# Patient Record
Sex: Female | Born: 1995 | Race: White | Hispanic: No | Marital: Married | Smoking: Never smoker
Health system: Southern US, Community
[De-identification: ages and names within clinical notes are randomized; demographics above are authoritative.]

---
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
## 2017-08-21 ENCOUNTER — Emergency Department (HOSPITAL_BASED_OUTPATIENT_CLINIC_OR_DEPARTMENT_OTHER)
Admission: EM | Admit: 2017-08-21 | Discharge: 2017-08-21 | Disposition: A | Discharge status: Home / Self Care | Payer: Managed Care, Other (non HMO) | Attending: Emergency Medicine | Admitting: Emergency Medicine

## 2017-08-21 ENCOUNTER — Encounter (HOSPITAL_BASED_OUTPATIENT_CLINIC_OR_DEPARTMENT_OTHER): Payer: Self-pay | Admitting: Emergency Medicine

## 2017-08-21 ENCOUNTER — Other Ambulatory Visit: Payer: Self-pay

## 2017-08-21 DIAGNOSIS — R070 Pain in throat: Secondary | ICD-10-CM | POA: Insufficient documentation

## 2017-08-21 DIAGNOSIS — J029 Acute pharyngitis, unspecified: Secondary | ICD-10-CM | POA: Diagnosis present

## 2017-08-21 DIAGNOSIS — M542 Cervicalgia: Secondary | ICD-10-CM | POA: Insufficient documentation

## 2017-08-21 MED ORDER — IBUPROFEN 800 MG PO TABS: 800.0000 mg | ORAL_TABLET | Freq: Three times a day (TID) | ORAL | 0 refills | Status: DC

## 2017-08-21 MED ORDER — LIDOCAINE VISCOUS 2 % MT SOLN: 20.0000 mL | OROMUCOSAL | 0 refills | Status: DC | PRN

## 2017-08-21 MED ORDER — HYDROCODONE-ACETAMINOPHEN 5-325 MG PO TABS: 1.0000 | ORAL_TABLET | Freq: Four times a day (QID) | ORAL | 0 refills | Status: DC | PRN

## 2017-08-21 MED ORDER — HYDROCODONE-ACETAMINOPHEN 5-325 MG PO TABS
1.0000 | ORAL_TABLET | Freq: Once | ORAL | Status: AC
  Administered 2017-08-21: 1 via ORAL
  Filled 2017-08-21: qty 1

## 2017-08-21 MED ORDER — LIDOCAINE VISCOUS 2 % MT SOLN
15.0000 mL | Freq: Once | OROMUCOSAL | Status: AC
  Administered 2017-08-21: 15 mL via OROMUCOSAL
  Filled 2017-08-21: qty 15

## 2017-08-21 NOTE — ED Triage Notes (Signed)
Patient states that she has a cyst in her throat and lately it has been getting worse. She states that it is bulging and it hurts to swallow

## 2017-08-23 NOTE — ED Provider Notes (Signed)
[facility] EMERGENCY DEPARTMENT Provider Note   CSN: [id number] Arrival date & time: 08/21/17  1627     History   Chief Complaint Chief Complaint  Patient presents with  . Sore Throat    HPI Linda Bradshaw is a 22 y.o. female.   Sore Throat  This is a recurrent problem. The current episode started more than 2 days ago. The problem occurs constantly. The problem has been gradually worsening. Pertinent negatives include no chest pain, no headaches and no shortness of breath. Nothing aggravates the symptoms. Nothing relieves the symptoms. She has tried nothing for the symptoms.    History reviewed. No pertinent past medical history.  There are no active problems to display for this patient.   History reviewed. No pertinent surgical history.   OB History   None      Home Medications    Prior to Admission medications   Medication Sig Start Date End Date Taking? Authorizing Provider  HYDROcodone-acetaminophen (NORCO/VICODIN) 5-325 MG tablet Take 1 tablet by mouth every 6 (six) hours as needed for severe pain. 08/21/17   Keila Turan, Barbara Cower, MD  ibuprofen (ADVIL,MOTRIN) 800 MG tablet Take 1 tablet (800 mg total) by mouth 3 (three) times daily. 08/21/17   Kailei Cowens, Barbara Cower, MD  lidocaine (XYLOCAINE) 2 % solution Use as directed 20 mLs in the mouth or throat as needed for mouth pain. 08/21/17   Lacole Komorowski, Barbara Cower, MD    Family History History reviewed. No pertinent family history.  Social History Social History   Tobacco Use  . Smoking status: Never Smoker  . Smokeless tobacco: Never Used  Substance Use Topics  . Alcohol use: Never    Frequency: Never  . Drug use: Never     Allergies   Patient has no known allergies.   Review of Systems Review of Systems  Respiratory: Negative for shortness of breath.   Cardiovascular: Negative for chest pain.  Neurological: Negative for headaches.  All other systems reviewed and are negative.    Physical Exam Updated Vital  Signs BP 131/79 (BP Location: Right Arm)   Pulse 82   Temp 98.3 F (36.8 C) (Oral)   Resp 16   Ht  (1.702 m)   Wt (!) 138 kg (304 lb 3.8 oz)   SpO2 100%   BMI 47.65 kg/m   Physical Exam  Constitutional: She is oriented to person, place, and time. She appears well-developed and well-nourished.  HENT:  Head: Normocephalic and atraumatic.  Bilateral tonsillar swelling. L>R, no obvious e/o abscess  Eyes: Pupils are equal, round, and reactive to light. EOM are normal.  Neck: Normal range of motion.  Cardiovascular: Normal rate and regular rhythm.  Pulmonary/Chest: Effort normal and breath sounds normal. No stridor. No respiratory distress.  Abdominal: Soft. Bowel sounds are normal. She exhibits no distension.  Musculoskeletal: Normal range of motion. She exhibits no edema or deformity.  Neurological: She is alert and oriented to person, place, and time. No cranial nerve deficit.  Skin: Skin is warm and dry.  Nursing note and vitals reviewed.    ED Treatments / Results  Labs (all labs ordered are listed, but only abnormal results are displayed) Labs Reviewed - No data to display  EKG None  Radiology No results found.  Procedures Procedures (including critical care time)  Medications Ordered in ED Medications  lidocaine (XYLOCAINE) 2 % viscous mouth solution 15 mL (15 mLs Mouth/Throat Given 08/21/17 1746)  HYDROcodone-acetaminophen (NORCO/VICODIN) 5-325 MG per tablet 1 tablet (1 tablet  Oral Given 08/21/17 1745)     Initial Impression / Assessment and Plan / ED Course  I have reviewed the triage vital signs and the nursing notes.  Pertinent labs & imaging results that were available during my care of the patient were reviewed by me and considered in my medical decision making (see chart for details).    Recurrent cyst. Needs to see ENT. Doubt abscess as it doesn't hurt and no fever, erythema, exudates to suggest same. No airway involvement. No difficulty swallowign.     Final Clinical Impressions(s) / ED Diagnoses   Final diagnoses:  Neck pain  Throat pain    ED Discharge Orders        Ordered    lidocaine (XYLOCAINE) 2 % solution  As needed     08/21/17 1739    HYDROcodone-acetaminophen (NORCO/VICODIN) 5-325 MG tablet  Every 6 hours PRN     08/21/17 1739    ibuprofen (ADVIL,MOTRIN) 800 MG tablet  3 times daily     08/21/17 1739       Shaeleigh Graw, Barbara Cower, MD 08/23/17 1526

## 2017-10-08 ENCOUNTER — Encounter (HOSPITAL_BASED_OUTPATIENT_CLINIC_OR_DEPARTMENT_OTHER): Payer: Self-pay | Admitting: Emergency Medicine

## 2017-10-08 ENCOUNTER — Emergency Department (HOSPITAL_BASED_OUTPATIENT_CLINIC_OR_DEPARTMENT_OTHER)
Admission: EM | Admit: 2017-10-08 | Discharge: 2017-10-09 | Disposition: A | Discharge status: Home / Self Care | Payer: Managed Care, Other (non HMO) | Attending: Emergency Medicine | Admitting: Emergency Medicine

## 2017-10-08 ENCOUNTER — Other Ambulatory Visit: Payer: Self-pay

## 2017-10-08 DIAGNOSIS — J029 Acute pharyngitis, unspecified: Secondary | ICD-10-CM | POA: Insufficient documentation

## 2017-10-08 DIAGNOSIS — Z79899 Other long term (current) drug therapy: Secondary | ICD-10-CM | POA: Insufficient documentation

## 2017-10-08 DIAGNOSIS — R51 Headache: Secondary | ICD-10-CM | POA: Diagnosis present

## 2017-10-08 LAB — RAPID STREP SCREEN (MED CTR MEBANE ONLY): Streptococcus, Group A Screen (Direct): NEGATIVE

## 2017-10-08 MED ORDER — ACETAMINOPHEN 325 MG PO TABS
650.0000 mg | ORAL_TABLET | Freq: Once | ORAL | Status: AC | PRN
  Administered 2017-10-08: 650 mg via ORAL
  Filled 2017-10-08: qty 2

## 2017-10-08 MED ORDER — METOCLOPRAMIDE HCL 5 MG/ML IJ SOLN
10.0000 mg | Freq: Once | INTRAMUSCULAR | Status: AC
  Administered 2017-10-08: 10 mg via INTRAVENOUS
  Filled 2017-10-08: qty 2

## 2017-10-08 MED ORDER — KETOROLAC TROMETHAMINE 15 MG/ML IJ SOLN
15.0000 mg | Freq: Once | INTRAMUSCULAR | Status: AC
  Administered 2017-10-08: 15 mg via INTRAVENOUS
  Filled 2017-10-08: qty 1

## 2017-10-08 MED ORDER — SODIUM CHLORIDE 0.9 % IV BOLUS
1000.0000 mL | Freq: Once | INTRAVENOUS | Status: AC
  Administered 2017-10-08: 1000 mL via INTRAVENOUS

## 2017-10-08 MED ORDER — DIPHENHYDRAMINE HCL 50 MG/ML IJ SOLN
25.0000 mg | Freq: Once | INTRAMUSCULAR | Status: AC
  Administered 2017-10-08: 25 mg via INTRAVENOUS
  Filled 2017-10-08: qty 1

## 2017-10-08 NOTE — ED Triage Notes (Signed)
Patient states that woke up with a sore throat and headache this am  - the patient also states that she is dizzy - the patient states that she has taken some OTC medications and that has not helped

## 2017-10-08 NOTE — ED Provider Notes (Signed)
[facility] Provider Note: Lowella DellJ. Lane Eknoor Novack, MD, FACEP  CSN: [id number] MRN: [id number] ARRIVAL: 10/08/17 at 1843 ROOM: [facility]03/[facility]03   CHIEF COMPLAINT  Headache and Sore Throat   HISTORY OF PRESENT ILLNESS  10/08/17 11:14 PM Linda Bradshaw is a 22 y.o. female who complains of a "really bad" headache and sore throat since this morning.  She states the pain is so severe it is making her lightheaded.  She has been nauseated today and vomited at work earlier.  She denies nausea at the present time.  She was noted to have a low-grade fever of 99.7 on arrival.  She has taken over-the-counter analgesics without relief.  Throat pain is worse with swallowing.   History reviewed. No pertinent past medical history.  History reviewed. No pertinent surgical history.  History reviewed. No pertinent family history.  Social History   Tobacco Use  . Smoking status: Never Smoker  . Smokeless tobacco: Never Used  Substance Use Topics  . Alcohol use: Never    Frequency: Never  . Drug use: Never    Prior to Admission medications   Medication Sig Start Date End Date Taking? Authorizing Provider  HYDROcodone-acetaminophen (NORCO/VICODIN) 5-325 MG tablet Take 1 tablet by mouth every 6 (six) hours as needed for severe pain. 08/21/17   Mesner, Barbara CowerJason, MD  ibuprofen (ADVIL,MOTRIN) 800 MG tablet Take 1 tablet (800 mg total) by mouth 3 (three) times daily. 08/21/17   Mesner, Barbara CowerJason, MD  lidocaine (XYLOCAINE) 2 % solution Use as directed 20 mLs in the mouth or throat as needed for mouth pain. 08/21/17   Mesner, Barbara CowerJason, MD    Allergies Patient has no known allergies.   REVIEW OF SYSTEMS  Negative except as noted here or in the History of Present Illness.   PHYSICAL EXAMINATION  Initial Vital Signs Blood pressure 94/72, pulse 94, temperature 99.7 F (37.6 C), temperature source Oral, resp. rate 18, height 5\' 6"  (1.676 m), weight 136.1 kg (300 lb), SpO2 100 %.  Examination General:  Well-developed, well-nourished female in no acute distress; appearance consistent with age of record HENT: normocephalic; atraumatic; no pharyngeal erythema or exudate Eyes: pupils equal, round and reactive to light; extraocular muscles intact Neck: supple; anterior cervical lymphadenopathy Heart: regular rate and rhythm Lungs: clear to auscultation bilaterally Abdomen: soft; nondistended; nontender; no masses or hepatosplenomegaly; bowel sounds present Extremities: No deformity; full range of motion; pulses normal Neurologic: Awake, alert and oriented; motor function intact in all extremities and symmetric; no facial droop Skin: Warm and dry Psychiatric: Normal mood and affect   RESULTS  Summary of this visit's results, reviewed by myself:   EKG Interpretation  Date/Time:    Ventricular Rate:    PR Interval:    QRS Duration:   QT Interval:    QTC Calculation:   R Axis:     Text Interpretation:        Laboratory Studies: Results for orders placed or performed during the hospital encounter of 10/08/17 (from the past 24 hour(s))  Rapid Strep Screen ([facility])     Status: None   Collection Time: 10/08/17  7:15 PM  Result Value Ref Range   Streptococcus, Group A Screen (Direct) NEGATIVE NEGATIVE  CBC with Differential/Platelet     Status: Abnormal   Collection Time: 10/08/17 11:29 PM  Result Value Ref Range   WBC 11.7 (H) 4.0 - 10.5 K/uL   RBC 4.33 3.87 - 5.11 MIL/uL   Hemoglobin 12.0 12.0 - 15.0 g/dL  HCT 35.8 (L) 36.0 - 46.0 %   MCV 82.7 78.0 - 100.0 fL   MCH 27.7 26.0 - 34.0 pg   MCHC 33.5 30.0 - 36.0 g/dL   RDW 16.1 09.6 - 04.5 %   Platelets 218 150 - 400 K/uL   Neutrophils Relative % 77 %   Neutro Abs 8.9 (H) 1.7 - 7.7 K/uL   Lymphocytes Relative 16 %   Lymphs Abs 1.9 0.7 - 4.0 K/uL   Monocytes Relative 7 %   Monocytes Absolute 0.8 0.1 - 1.0 K/uL   Eosinophils Relative 0 %   Eosinophils Absolute 0.0 0.0 - 0.7 K/uL   Basophils Relative 0 %   Basophils  Absolute 0.0 0.0 - 0.1 K/uL  Mononucleosis screen     Status: None   Collection Time: 10/08/17 11:29 PM  Result Value Ref Range   Mono Screen NEGATIVE NEGATIVE   Imaging Studies: No results found.  ED COURSE and MDM  Nursing notes and initial vitals signs, including pulse oximetry, reviewed.  Vitals:   10/08/17 1845 10/08/17 2015 10/08/17 2231  BP: 118/72 121/62 94/72  Pulse: (!) 102  94  Resp: 20 18 18   Temp: 99.7 F (37.6 C)    TempSrc: Oral    SpO2: 99% 100% 100%  Weight: 136.1 kg (300 lb)    Height: 5\' 6"  (1.676 m)     12:38 AM Patient feeling better after IV fluids and medications.  No evidence of strep or mononucleosis on lab work although she was advised that mononucleosis may not show at this early stage.  PROCEDURES    ED DIAGNOSES     ICD-10-CM   1. Viral pharyngitis J02.9        Najee Cowens, MD 10/09/17 [id number]

## 2017-10-09 LAB — MONONUCLEOSIS SCREEN: Mono Screen: NEGATIVE

## 2017-10-09 LAB — CBC WITH DIFFERENTIAL/PLATELET
Basophils Absolute: 0 10*3/uL (ref 0.0–0.1)
Basophils Relative: 0 %
Eosinophils Absolute: 0 10*3/uL (ref 0.0–0.7)
Eosinophils Relative: 0 %
HCT: 35.8 % — ABNORMAL LOW (ref 36.0–46.0)
HEMOGLOBIN: 12 g/dL (ref 12.0–15.0)
LYMPHS ABS: 1.9 10*3/uL (ref 0.7–4.0)
LYMPHS PCT: 16 %
MCH: 27.7 pg (ref 26.0–34.0)
MCHC: 33.5 g/dL (ref 30.0–36.0)
MCV: 82.7 fL (ref 78.0–100.0)
Monocytes Absolute: 0.8 10*3/uL (ref 0.1–1.0)
Monocytes Relative: 7 %
NEUTROS ABS: 8.9 10*3/uL — AB (ref 1.7–7.7)
NEUTROS PCT: 77 %
Platelets: 218 10*3/uL (ref 150–400)
RBC: 4.33 MIL/uL (ref 3.87–5.11)
RDW: 12.8 % (ref 11.5–15.5)
WBC: 11.7 10*3/uL — ABNORMAL HIGH (ref 4.0–10.5)

## 2017-10-11 LAB — CULTURE, GROUP A STREP (THRC)

## 2018-11-24 ENCOUNTER — Emergency Department (HOSPITAL_COMMUNITY): Payer: Managed Care, Other (non HMO)

## 2018-11-24 ENCOUNTER — Other Ambulatory Visit: Payer: Self-pay

## 2018-11-24 ENCOUNTER — Observation Stay (HOSPITAL_COMMUNITY)
Admission: EM | Admit: 2018-11-24 | Discharge: 2018-11-25 | Disposition: A | Discharge status: Home / Self Care | Payer: Managed Care, Other (non HMO) | Attending: Orthopedic Surgery | Admitting: Orthopedic Surgery

## 2018-11-24 ENCOUNTER — Encounter (HOSPITAL_COMMUNITY): Payer: Self-pay

## 2018-11-24 DIAGNOSIS — S82422A Displaced transverse fracture of shaft of left fibula, initial encounter for closed fracture: Secondary | ICD-10-CM

## 2018-11-24 DIAGNOSIS — S82402B Unspecified fracture of shaft of left fibula, initial encounter for open fracture type I or II: Secondary | ICD-10-CM | POA: Diagnosis present

## 2018-11-24 DIAGNOSIS — Z20828 Contact with and (suspected) exposure to other viral communicable diseases: Secondary | ICD-10-CM | POA: Insufficient documentation

## 2018-11-24 DIAGNOSIS — Z23 Encounter for immunization: Secondary | ICD-10-CM | POA: Diagnosis not present

## 2018-11-24 DIAGNOSIS — Z6841 Body Mass Index (BMI) 40.0 and over, adult: Secondary | ICD-10-CM | POA: Diagnosis not present

## 2018-11-24 DIAGNOSIS — W109XXA Fall (on) (from) unspecified stairs and steps, initial encounter: Secondary | ICD-10-CM | POA: Insufficient documentation

## 2018-11-24 DIAGNOSIS — S82392B Other fracture of lower end of left tibia, initial encounter for open fracture type I or II: Secondary | ICD-10-CM

## 2018-11-24 DIAGNOSIS — S8262XB Displaced fracture of lateral malleolus of left fibula, initial encounter for open fracture type I or II: Secondary | ICD-10-CM | POA: Diagnosis not present

## 2018-11-24 DIAGNOSIS — E669 Obesity, unspecified: Secondary | ICD-10-CM | POA: Insufficient documentation

## 2018-11-24 DIAGNOSIS — S82432B Displaced oblique fracture of shaft of left fibula, initial encounter for open fracture type I or II: Secondary | ICD-10-CM

## 2018-11-24 MED ORDER — CEFAZOLIN SODIUM-DEXTROSE 2-4 GM/100ML-% IV SOLN
2.0000 g | Freq: Once | INTRAVENOUS | Status: AC
  Administered 2018-11-24: 2 g via INTRAVENOUS
  Filled 2018-11-24: qty 100

## 2018-11-24 MED ORDER — FENTANYL CITRATE (PF) 100 MCG/2ML IJ SOLN: 100.0000 ug | Freq: Once | INTRAMUSCULAR | Status: DC

## 2018-11-24 MED ORDER — HYDROMORPHONE HCL 1 MG/ML IJ SOLN
1.0000 mg | Freq: Once | INTRAMUSCULAR | Status: AC
  Administered 2018-11-24: 1 mg via INTRAVENOUS
  Filled 2018-11-24: qty 1

## 2018-11-24 MED ORDER — TETANUS-DIPHTH-ACELL PERTUSSIS 5-2.5-18.5 LF-MCG/0.5 IM SUSP
0.5000 mL | Freq: Once | INTRAMUSCULAR | Status: AC
  Administered 2018-11-24: 0.5 mL via INTRAMUSCULAR
  Filled 2018-11-24: qty 0.5

## 2018-11-24 MED ORDER — OXYCODONE-ACETAMINOPHEN 5-325 MG PO TABS
1.0000 | ORAL_TABLET | Freq: Once | ORAL | Status: AC
  Administered 2018-11-24: 1 via ORAL
  Filled 2018-11-24: qty 1

## 2018-11-24 NOTE — H&P (Signed)
Linda Bradshaw is an 23 y.o. female.  HPI: Patient is an obese otherwise healthy 23 year old female who fell earlier today.  She suffered a fracture of her distal fibula with medial clear space widening.  She had a medial wound which continue to bleed which is concerning for open fracture.  She obviously need surgical intervention and because of the risk of contamination we brought her to the hospital for IV antibiotics overnight and will fix her when operative time available tomorrow.  History reviewed. No pertinent past medical history.  History reviewed. No pertinent surgical history.  History reviewed. No pertinent family history.  Social History:  reports that she has never smoked. She has never used smokeless tobacco. She reports that she does not drink alcohol or use drugs.  Allergies: No Known Allergies  Medications: I have reviewed the patient's current medications.  No results found for this or any previous visit (from the past 48 hour(s)).  Dg Tibia/fibula Left  Result Date: 11/24/2018 CLINICAL DATA:  Injury EXAM: LEFT TIBIA AND FIBULA - 2 VIEW COMPARISON:  None. FINDINGS: Acute mildly displaced distal tibial fibular diaphyseal fracture. Mid and proximal tibia and fibula appear intact. IMPRESSION: Proximal and mid tibia and fibula appear intact. See separately dictated ankle report Electronically Signed   By: Donavan Foil M.D.   On: 11/24/2018 20:25   Dg Ankle Complete Left  Result Date: 11/24/2018 CLINICAL DATA:  Deformity, fall EXAM: LEFT ANKLE COMPLETE - 3+ VIEW COMPARISON:  None. FINDINGS: Acute fracture involving the distal shaft of the fibula at the junction of the middle and distal thirds. Less than 1/4 bone with lateral displacement of distal fracture fragment. Multiple osseous fragment posterior to the ankle, possibly posterior malleolar fracture fragments. Disruption of the ankle mortise which is widened medially. There is mild widening of the lateral clear space.  IMPRESSION: 1. Acute mildly displaced fracture involving the distal shaft of the fibula at the junction of the middle and distal thirds. 2. Multiple fracture fragments posterior to the ankle on lateral view, possibly from the posterior malleolus. Disruption of the ankle mortise with widening of the medial and lateral clear space. Electronically Signed   By: Donavan Foil M.D.   On: 11/24/2018 20:24    ROS  ROS: I have reviewed the patient's review of systems thoroughly and there are no positive responses as relates to the HPI. Blood pressure 129/66, pulse 83, temperature 98.4 F (36.9 C), temperature source Oral, resp. rate 12, height 5\' 7"  (1.702 m), SpO2 100 %. Physical Exam Well-developed well-nourished patient in no acute distress. Alert and oriented x3 HEENT:within normal limits Cardiac: Regular rate and rhythm Pulmonary: Lungs clear to auscultation Abdomen: Soft and nontender.  Normal active bowel sounds  Musculoskeletal: Left leg: Small punctate wound over the medial aspect with continuous bleeding.  Pain with all range of motion.  Neurovascular intact distally.  Assessment/Plan: Patient has a Weber C ankle fracture with mortise widening that is likely open based on a small punctate wound which will not stop bleeding.//Clear the patient needs surgery and I think because of the risk that this could be an open injury I think she probably needs to be admitted to the hospital overnight for IV antibiotics and taken the operating room when operative time available.  The patient is well aware of the risks of bleeding, infection, need for further surgery, and failure of the surgery to help.  She is well aware that there is a small risk of death and around the  time of surgery.  Understanding these risks he does wish to proceed.  Harvie JuniorJohn L Comfort Iversen 11/24/2018, 11:19 PM

## 2018-11-24 NOTE — ED Triage Notes (Signed)
Per EMS, Pt was walking down a set of steps and landed wrong. Pt has a obvious deformity, and instability of left ankle. Small laceration on left lateral side of ankle. Pt states she saw bone in wound, EMS did not observe any bone. Good pulses and cap refill, some numbness on lateral side of left leg. 150 mcg of fentanyl given in route to ED.

## 2018-11-24 NOTE — ED Provider Notes (Addendum)
[facility]   CSN: [id number] Arrival date & time: 11/24/18  [id number]    History   Chief Complaint Chief Complaint  Patient presents with   Ankle Pain    HPI Linda Bradshaw is a 23 y.o. female presenting to the emergency department with sudden onset of left ankle pain after trip and fall down the steps.  She states she scraped to the medial aspect of her ankle on the concrete.  She not hit her head or pass out.  She has an abrasion to the right ankle as well, however is having no trouble ambulating or pain associated.  She was given fentanyl in route which provided temporary relief of symptoms, however her pain returns on evaluation.  Denies numbness or tingling.  Last tetanus vaccine is unknown.     The history is provided by the patient.    History reviewed. No pertinent past medical history.  There are no active problems to display for this patient.   History reviewed. No pertinent surgical history.   OB History   No obstetric history on file.      Home Medications    Prior to Admission medications   Medication Sig Start Date End Date Taking? Authorizing Provider  ibuprofen (ADVIL) 200 MG tablet Take 400 mg by mouth daily as needed for headache.   Yes [provider]  levonorgestrel (MIRENA) 20 MCG/24HR IUD 1 Intra Uterine Device by Intrauterine route once.   Yes [provider]  ibuprofen (ADVIL,MOTRIN) 800 MG tablet Take 1 tablet (800 mg total) by mouth 3 (three) times daily. Patient not taking: Reported on 11/24/2018 08/21/17   Mesner, Barbara CowerJason, MD  lidocaine (XYLOCAINE) 2 % solution Use as directed 20 mLs in the mouth or throat as needed for mouth pain. Patient not taking: Reported on 11/24/2018 08/21/17   Mesner, Barbara CowerJason, MD    Family History History reviewed. No pertinent family history.  Social History Social History   Tobacco Use   Smoking status: Never Smoker   Smokeless tobacco: Never Used    Substance Use Topics   Alcohol use: Never    Frequency: Never   Drug use: Never     Allergies   Patient has no known allergies.   Review of Systems Review of Systems  Musculoskeletal: Positive for arthralgias and joint swelling.  Skin: Positive for wound.  Neurological: Negative for numbness.  All other systems reviewed and are negative.    Physical Exam Updated Vital Signs BP 129/66 (BP Location: Left Arm)    Pulse 83    Temp 98.4 F (36.9 C) (Oral)    Resp 12    Ht 5\' 7"  (1.702 m)    SpO2 100%    BMI 46.99 kg/m   Physical Exam Vitals signs and nursing note reviewed.  Constitutional:      Appearance: She is well-developed.  HENT:     Head: Normocephalic and atraumatic.  Eyes:     Conjunctiva/sclera: Conjunctivae normal.  Cardiovascular:     Rate and Rhythm: Normal rate.  Pulmonary:     Effort: Pulmonary effort is normal.  Abdominal:     Palpations: Abdomen is soft.  Musculoskeletal:     Comments: Left ankle with swelling and slight deformity.  There is generalized tenderness to the ankle joint.  There is an abrasion/puncture wound to the medial ankle proximal to the medial malleolus.  Intact dorsalis pedis pulse.  Normal distal sensation.  Calf is soft.  Right lateral ankle  with superficial abrasion, no tenderness.  Full active range of motion without pain.  Skin:    General: Skin is warm.  Neurological:     Mental Status: She is alert.  Psychiatric:        Behavior: Behavior normal.      ED Treatments / Results  Labs (all labs ordered are listed, but only abnormal results are displayed) Labs Reviewed  SARS CORONAVIRUS 2 (HOSPITAL ORDER, PERFORMED IN [facility])  BASIC METABOLIC PANEL  CBC WITH DIFFERENTIAL/PLATELET    EKG None  Radiology Dg Tibia/fibula Left  Result Date: 11/24/2018 CLINICAL DATA:  Injury EXAM: LEFT TIBIA AND FIBULA - 2 VIEW COMPARISON:  None. FINDINGS: Acute mildly displaced distal tibial fibular diaphyseal  fracture. Mid and proximal tibia and fibula appear intact. IMPRESSION: Proximal and mid tibia and fibula appear intact. See separately dictated ankle report Electronically Signed   By: Jasmine PangKim  Fujinaga M.D.   On: 11/24/2018 20:25   Dg Ankle Complete Left  Result Date: 11/24/2018 CLINICAL DATA:  Deformity, fall EXAM: LEFT ANKLE COMPLETE - 3+ VIEW COMPARISON:  None. FINDINGS: Acute fracture involving the distal shaft of the fibula at the junction of the middle and distal thirds. Less than 1/4 bone with lateral displacement of distal fracture fragment. Multiple osseous fragment posterior to the ankle, possibly posterior malleolar fracture fragments. Disruption of the ankle mortise which is widened medially. There is mild widening of the lateral clear space. IMPRESSION: 1. Acute mildly displaced fracture involving the distal shaft of the fibula at the junction of the middle and distal thirds. 2. Multiple fracture fragments posterior to the ankle on lateral view, possibly from the posterior malleolus. Disruption of the ankle mortise with widening of the medial and lateral clear space. Electronically Signed   By: Jasmine PangKim  Fujinaga M.D.   On: 11/24/2018 20:24    Procedures Procedures (including critical care time)  Medications Ordered in ED Medications  ceFAZolin (ANCEF) IVPB 2g/100 mL premix (has no administration in time range)  HYDROmorphone (DILAUDID) injection 1 mg (has no administration in time range)  Tdap (BOOSTRIX) injection 0.5 mL (0.5 mLs Intramuscular Given 11/24/18 2103)  oxyCODONE-acetaminophen (PERCOCET/ROXICET) 5-325 MG per tablet 1 tablet (1 tablet Oral Given 11/24/18 2101)     Initial Impression / Assessment and Plan / ED Course  I have reviewed the triage vital signs and the nursing notes.  Pertinent labs & imaging results that were available during my care of the patient were reviewed by me and considered in my medical decision making (see chart for details).  Clinical Course as of Nov 23 2308  Caleen EssexFri Nov 24, 2018  2052 Consulted orthopedics, Dr. Luiz BlareGraves.  Recommends Xeroform over wound, bulky compressive dressing with splint, compression to help with swelling. See outpt Monday morning.    [JR]    Clinical Course User Index [JR] Carey Lafon, [place] N, PA-C      Patient with left ankle fracture after a fall down the steps.  There is a displaced fracture of the distal fibular shaft and fracture of the suspected posterior malleolus of the left ankle with mortise widening.  Neurovascularly intact.  Compartments are soft. Tetanus updated.  There is a wound over the medial aspect of the ankle, a few inches proximal to the medial malleolus. Initially not a suspicious for an open fracture given the location of the wound and the location of the fractures, however wound continued to ooze despite greater than 10 minutes of direct pressure.  Reconsulted Dr. Luiz BlareGraves, who  is already in the ED for another patient.  He evaluated patient at bedside and will go ahead and admit patient tonight for operative procedure tomorrow morning.  Concern for possible communication of wound with fracture given persistence of bleeding.  Ordered Ancef per Dr. Berenice Primas.  Appreciate consult.   The patient appears reasonably stabilized for admission considering the current resources, flow, and capabilities available in the ED at this time, and I doubt any other [facility] requiring further screening and/or treatment in the ED prior to admission. Final Clinical Impressions(s) / ED Diagnoses   Final diagnoses:  Other type I or II open fracture of distal end of left tibia, initial encounter  Closed displaced transverse fracture of shaft of left fibula, initial encounter    ED Discharge Orders    None       Milliana Reddoch, [place] N, PA-C 11/24/18 2310    Zethan Alfieri, [place] N, PA-C 11/25/18 6754    Milton Ferguson, MD 11/28/18 [phone]

## 2018-11-25 ENCOUNTER — Encounter (HOSPITAL_COMMUNITY): Payer: Self-pay | Admitting: *Deleted

## 2018-11-25 ENCOUNTER — Observation Stay (HOSPITAL_COMMUNITY): Payer: Managed Care, Other (non HMO) | Admitting: Certified Registered Nurse Anesthetist

## 2018-11-25 ENCOUNTER — Other Ambulatory Visit: Payer: Self-pay

## 2018-11-25 ENCOUNTER — Inpatient Hospital Stay: Admit: 2018-11-25 | Payer: Managed Care, Other (non HMO) | Admitting: Orthopedic Surgery

## 2018-11-25 ENCOUNTER — Encounter (HOSPITAL_COMMUNITY)
Admission: EM | Disposition: A | Discharge status: Home / Self Care | Payer: Self-pay | Source: Home / Self Care | Attending: Emergency Medicine

## 2018-11-25 DIAGNOSIS — S82402B Unspecified fracture of shaft of left fibula, initial encounter for open fracture type I or II: Secondary | ICD-10-CM | POA: Diagnosis present

## 2018-11-25 HISTORY — PX: ORIF ANKLE FRACTURE: SHX5408

## 2018-11-25 LAB — CBC WITH DIFFERENTIAL/PLATELET
Abs Immature Granulocytes: 0.04 10*3/uL (ref 0.00–0.07)
Basophils Absolute: 0 10*3/uL (ref 0.0–0.1)
Basophils Relative: 0 %
Eosinophils Absolute: 0 10*3/uL (ref 0.0–0.5)
Eosinophils Relative: 0 %
HCT: 33.9 % — ABNORMAL LOW (ref 36.0–46.0)
Hemoglobin: 10.9 g/dL — ABNORMAL LOW (ref 12.0–15.0)
Immature Granulocytes: 0 %
Lymphocytes Relative: 19 %
Lymphs Abs: 2.6 10*3/uL (ref 0.7–4.0)
MCH: 27.3 pg (ref 26.0–34.0)
MCHC: 32.2 g/dL (ref 30.0–36.0)
MCV: 84.8 fL (ref 80.0–100.0)
Monocytes Absolute: 0.7 10*3/uL (ref 0.1–1.0)
Monocytes Relative: 5 %
Neutro Abs: 10.1 10*3/uL — ABNORMAL HIGH (ref 1.7–7.7)
Neutrophils Relative %: 76 %
Platelets: 289 10*3/uL (ref 150–400)
RBC: 4 MIL/uL (ref 3.87–5.11)
RDW: 12.5 % (ref 11.5–15.5)
WBC: 13.5 10*3/uL — ABNORMAL HIGH (ref 4.0–10.5)
nRBC: 0 % (ref 0.0–0.2)

## 2018-11-25 LAB — BASIC METABOLIC PANEL
Anion gap: 9 (ref 5–15)
BUN: 16 mg/dL (ref 6–20)
CO2: 21 mmol/L — ABNORMAL LOW (ref 22–32)
Calcium: 8.5 mg/dL — ABNORMAL LOW (ref 8.9–10.3)
Chloride: 108 mmol/L (ref 98–111)
Creatinine, Ser: 0.69 mg/dL (ref 0.44–1.00)
GFR calc Af Amer: >60 mL/min (ref >60)
GFR calc non Af Amer: >60 mL/min (ref >60)
Glucose, Bld: 110 mg/dL — ABNORMAL HIGH (ref 70–99)
Potassium: 3.4 mmol/L — ABNORMAL LOW (ref 3.5–5.1)
Sodium: 138 mmol/L (ref 135–145)

## 2018-11-25 LAB — SARS CORONAVIRUS 2 BY RT PCR (HOSPITAL ORDER, PERFORMED IN ~~LOC~~ HOSPITAL LAB): SARS Coronavirus 2: NEGATIVE

## 2018-11-25 LAB — SURGICAL PCR SCREEN
MRSA, PCR: NEGATIVE
Staphylococcus aureus: NEGATIVE

## 2018-11-25 SURGERY — OPEN REDUCTION INTERNAL FIXATION (ORIF) ANKLE FRACTURE
Anesthesia: General | Site: Ankle | Laterality: Left

## 2018-11-25 MED ORDER — BUPIVACAINE HCL (PF) 0.5 % IJ SOLN
INTRAMUSCULAR | Status: DC | PRN
  Administered 2018-11-25: 20 mL

## 2018-11-25 MED ORDER — MIDAZOLAM HCL 5 MG/5ML IJ SOLN
INTRAMUSCULAR | Status: DC | PRN
  Administered 2018-11-25: 2 mg via INTRAVENOUS

## 2018-11-25 MED ORDER — OXYCODONE-ACETAMINOPHEN 5-325 MG PO TABS: 1.0000 | ORAL_TABLET | Freq: Four times a day (QID) | ORAL | 0 refills | Status: DC | PRN

## 2018-11-25 MED ORDER — SODIUM CHLORIDE 0.9 % IV SOLN
INTRAVENOUS | Status: AC
  Filled 2018-11-25: qty 500000

## 2018-11-25 MED ORDER — DEXTROSE 5 % IV SOLN
INTRAVENOUS | Status: DC | PRN
  Administered 2018-11-25: 14:00:00 3 g via INTRAVENOUS

## 2018-11-25 MED ORDER — BUPIVACAINE HCL (PF) 0.5 % IJ SOLN
INTRAMUSCULAR | Status: AC
  Filled 2018-11-25: qty 30

## 2018-11-25 MED ORDER — HYDROMORPHONE HCL 1 MG/ML IJ SOLN: 0.2500 mg | INTRAMUSCULAR | Status: DC | PRN

## 2018-11-25 MED ORDER — OXYCODONE HCL 5 MG PO TABS
5.0000 mg | ORAL_TABLET | ORAL | Status: DC | PRN
  Administered 2018-11-25: 10 mg via ORAL
  Filled 2018-11-25: qty 2

## 2018-11-25 MED ORDER — CEFAZOLIN SODIUM-DEXTROSE 2-4 GM/100ML-% IV SOLN
INTRAVENOUS | Status: AC
  Filled 2018-11-25: qty 200

## 2018-11-25 MED ORDER — FENTANYL CITRATE (PF) 250 MCG/5ML IJ SOLN
INTRAMUSCULAR | Status: AC
  Filled 2018-11-25: qty 5

## 2018-11-25 MED ORDER — ONDANSETRON HCL 4 MG/2ML IJ SOLN
INTRAMUSCULAR | Status: DC | PRN
  Administered 2018-11-25: 4 mg via INTRAVENOUS

## 2018-11-25 MED ORDER — LIDOCAINE 2% (20 MG/ML) 5 ML SYRINGE
INTRAMUSCULAR | Status: DC | PRN
  Administered 2018-11-25: 100 mg via INTRAVENOUS

## 2018-11-25 MED ORDER — HYDROMORPHONE HCL 1 MG/ML IJ SOLN
0.5000 mg | INTRAMUSCULAR | Status: DC | PRN
  Filled 2018-11-25: qty 1

## 2018-11-25 MED ORDER — SUCCINYLCHOLINE CHLORIDE 200 MG/10ML IV SOSY
PREFILLED_SYRINGE | INTRAVENOUS | Status: DC | PRN
  Administered 2018-11-25: 160 mg via INTRAVENOUS

## 2018-11-25 MED ORDER — PROPOFOL 10 MG/ML IV BOLUS
INTRAVENOUS | Status: AC
  Filled 2018-11-25: qty 20

## 2018-11-25 MED ORDER — 0.9 % SODIUM CHLORIDE (POUR BTL) OPTIME
TOPICAL | Status: DC | PRN
  Administered 2018-11-25: 1000 mL

## 2018-11-25 MED ORDER — METHOCARBAMOL 500 MG PO TABS: 500.0000 mg | ORAL_TABLET | Freq: Four times a day (QID) | ORAL | Status: DC | PRN

## 2018-11-25 MED ORDER — ONDANSETRON HCL 4 MG/2ML IJ SOLN
INTRAMUSCULAR | Status: AC
  Filled 2018-11-25: qty 2

## 2018-11-25 MED ORDER — METOCLOPRAMIDE HCL 5 MG/ML IJ SOLN
10.0000 mg | Freq: Four times a day (QID) | INTRAMUSCULAR | Status: DC | PRN
  Administered 2018-11-25: 10 mg via INTRAVENOUS
  Filled 2018-11-25: qty 2

## 2018-11-25 MED ORDER — FENTANYL CITRATE (PF) 250 MCG/5ML IJ SOLN
INTRAMUSCULAR | Status: DC | PRN
  Administered 2018-11-25: 50 ug via INTRAVENOUS
  Administered 2018-11-25 (×2): 100 ug via INTRAVENOUS

## 2018-11-25 MED ORDER — ZOLPIDEM TARTRATE 5 MG PO TABS: 5.0000 mg | ORAL_TABLET | Freq: Every evening | ORAL | Status: DC | PRN

## 2018-11-25 MED ORDER — ACETAMINOPHEN 325 MG PO TABS: 325.0000 mg | ORAL_TABLET | Freq: Four times a day (QID) | ORAL | Status: DC | PRN

## 2018-11-25 MED ORDER — SUCCINYLCHOLINE CHLORIDE 200 MG/10ML IV SOSY
PREFILLED_SYRINGE | INTRAVENOUS | Status: AC
  Filled 2018-11-25: qty 10

## 2018-11-25 MED ORDER — ONDANSETRON HCL 4 MG/2ML IJ SOLN
4.0000 mg | Freq: Four times a day (QID) | INTRAMUSCULAR | Status: DC | PRN
  Administered 2018-11-25: 4 mg via INTRAVENOUS
  Filled 2018-11-25: qty 2

## 2018-11-25 MED ORDER — ROCURONIUM BROMIDE 10 MG/ML (PF) SYRINGE
PREFILLED_SYRINGE | INTRAVENOUS | Status: AC
  Filled 2018-11-25: qty 10

## 2018-11-25 MED ORDER — METOCLOPRAMIDE HCL 5 MG/ML IJ SOLN: 10.0000 mg | Freq: Four times a day (QID) | INTRAMUSCULAR | Status: DC

## 2018-11-25 MED ORDER — METHOCARBAMOL 1000 MG/10ML IJ SOLN
500.0000 mg | Freq: Four times a day (QID) | INTRAVENOUS | Status: DC | PRN
  Filled 2018-11-25: qty 5

## 2018-11-25 MED ORDER — DEXAMETHASONE SODIUM PHOSPHATE 10 MG/ML IJ SOLN
INTRAMUSCULAR | Status: AC
  Filled 2018-11-25: qty 1

## 2018-11-25 MED ORDER — LACTATED RINGERS IV SOLN
INTRAVENOUS | Status: DC | PRN
  Administered 2018-11-25 (×2): via INTRAVENOUS

## 2018-11-25 MED ORDER — SUGAMMADEX SODIUM 200 MG/2ML IV SOLN
INTRAVENOUS | Status: DC | PRN
  Administered 2018-11-25: 300 mg via INTRAVENOUS

## 2018-11-25 MED ORDER — MUPIROCIN 2 % EX OINT: 1.0000 "application " | TOPICAL_OINTMENT | Freq: Two times a day (BID) | CUTANEOUS | Status: DC

## 2018-11-25 MED ORDER — ROCURONIUM BROMIDE 10 MG/ML (PF) SYRINGE
PREFILLED_SYRINGE | INTRAVENOUS | Status: DC | PRN
  Administered 2018-11-25: 10 mg via INTRAVENOUS
  Administered 2018-11-25: 50 mg via INTRAVENOUS

## 2018-11-25 MED ORDER — DEXAMETHASONE SODIUM PHOSPHATE 10 MG/ML IJ SOLN
INTRAMUSCULAR | Status: DC | PRN
  Administered 2018-11-25: 10 mg via INTRAVENOUS

## 2018-11-25 MED ORDER — MIDAZOLAM HCL 2 MG/2ML IJ SOLN
INTRAMUSCULAR | Status: AC
  Filled 2018-11-25: qty 2

## 2018-11-25 MED ORDER — PROPOFOL 10 MG/ML IV BOLUS
INTRAVENOUS | Status: DC | PRN
  Administered 2018-11-25: 200 mg via INTRAVENOUS

## 2018-11-25 MED ORDER — HYDROMORPHONE HCL 1 MG/ML IJ SOLN
INTRAMUSCULAR | Status: DC | PRN
  Administered 2018-11-25 (×2): 1 mg via INTRAVENOUS

## 2018-11-25 MED ORDER — HYDROMORPHONE HCL 2 MG/ML IJ SOLN
INTRAMUSCULAR | Status: AC
  Filled 2018-11-25: qty 1

## 2018-11-25 MED ORDER — CEPHALEXIN 500 MG PO CAPS: 500.0000 mg | ORAL_CAPSULE | Freq: Three times a day (TID) | ORAL | 0 refills | Status: DC

## 2018-11-25 MED ORDER — SODIUM CHLORIDE 0.9 % IV SOLN
INTRAVENOUS | Status: DC | PRN
  Administered 2018-11-25: 500 mL

## 2018-11-25 MED ORDER — SODIUM CHLORIDE 0.9 % IV SOLN
INTRAVENOUS | Status: DC
  Administered 2018-11-25: 06:00:00 via INTRAVENOUS

## 2018-11-25 SURGICAL SUPPLY — 49 items
BAG ZIPLOCK 12X15 (MISCELLANEOUS) ×2 IMPLANT
BIT DRILL 2.5X2.75 QC CALB (BIT) ×2 IMPLANT
CHLORAPREP W/TINT 26 (MISCELLANEOUS) ×4 IMPLANT
COVER SURGICAL LIGHT HANDLE (MISCELLANEOUS) ×2 IMPLANT
COVER WAND RF STERILE (DRAPES) IMPLANT
CUFF TOURN SGL QUICK 42 (TOURNIQUET CUFF) ×2 IMPLANT
DECANTER SPIKE VIAL GLASS SM (MISCELLANEOUS) ×2 IMPLANT
DRAPE C-ARM 42X120 X-RAY (DRAPES) ×2 IMPLANT
DRAPE U-SHAPE 47X51 STRL (DRAPES) ×2 IMPLANT
DRSG ADAPTIC 3X8 NADH LF (GAUZE/BANDAGES/DRESSINGS) ×2 IMPLANT
DRSG PAD ABDOMINAL 8X10 ST (GAUZE/BANDAGES/DRESSINGS) ×4 IMPLANT
ELECT REM PT RETURN 15FT ADLT (MISCELLANEOUS) ×2 IMPLANT
FIXATION ZIPTIGHT ANKLE SNDSMS (Ankle) ×2 IMPLANT
GAUZE SPONGE 4X4 12PLY STRL (GAUZE/BANDAGES/DRESSINGS) ×2 IMPLANT
GAUZE XEROFORM 5X9 LF (GAUZE/BANDAGES/DRESSINGS) ×2 IMPLANT
GLOVE BIO SURGEON STRL SZ8 (GLOVE) ×4 IMPLANT
GLOVE ECLIPSE 7.5 STRL STRAW (GLOVE) ×6 IMPLANT
GLOVE SURG SS PI 8.0 STRL IVOR (GLOVE) ×2 IMPLANT
GOWN STRL REUS W/TWL LRG LVL3 (GOWN DISPOSABLE) ×2 IMPLANT
GOWN STRL REUS W/TWL XL LVL3 (GOWN DISPOSABLE) ×4 IMPLANT
KIT TURNOVER KIT A (KITS) ×2 IMPLANT
MANIFOLD NEPTUNE II (INSTRUMENTS) ×2 IMPLANT
NEEDLE HYPO 22GX1.5 SAFETY (NEEDLE) ×2 IMPLANT
PACK ORTHO EXTREMITY (CUSTOM PROCEDURE TRAY) ×2 IMPLANT
PAD CAST 4YDX4 CTTN HI CHSV (CAST SUPPLIES) ×2 IMPLANT
PADDING CAST COTTON 4X4 STRL (CAST SUPPLIES) ×2
PADDING CAST SYN 6 (CAST SUPPLIES) ×1
PADDING CAST SYNTHETIC 4 (CAST SUPPLIES) ×2
PADDING CAST SYNTHETIC 4X4 STR (CAST SUPPLIES) ×2 IMPLANT
PADDING CAST SYNTHETIC 6X4 NS (CAST SUPPLIES) ×1 IMPLANT
PLATE LOCK COMP 9H 3.5 FOOT (Plate) ×2 IMPLANT
SCREW CORTICAL 3.5MM  16MM (Screw) ×1 IMPLANT
SCREW CORTICAL 3.5MM 14MM (Screw) ×12 IMPLANT
SCREW CORTICAL 3.5MM 16MM (Screw) ×1 IMPLANT
SOL PREP PROV IODINE SCRUB 4OZ (MISCELLANEOUS) ×2 IMPLANT
SPLINT FIBERGLASS 4X30 (CAST SUPPLIES) ×4 IMPLANT
STAPLER VISISTAT 35W (STAPLE) ×2 IMPLANT
SUCTION FRAZIER HANDLE 12FR (TUBING) ×1
SUCTION TUBE FRAZIER 12FR DISP (TUBING) ×1 IMPLANT
SUT ETHILON 2 0 PS N (SUTURE) ×2 IMPLANT
SUT ETHILON 3 0 PS 1 (SUTURE) ×2 IMPLANT
SUT ETHILON 4 0 PS 2 18 (SUTURE) IMPLANT
SUT VIC AB 0 CT2 27 (SUTURE) ×2 IMPLANT
SUT VIC AB 2-0 CT1 27 (SUTURE) ×1
SUT VIC AB 2-0 CT1 TAPERPNT 27 (SUTURE) ×1 IMPLANT
SUT VIC AB 3-0 SH 27 (SUTURE)
SUT VIC AB 3-0 SH 27XBRD (SUTURE) IMPLANT
SYR CONTROL 10ML LL (SYRINGE) ×2 IMPLANT
ZIPTIGHT ANKLE SYNODESMOSS FIX (Ankle) ×4 IMPLANT

## 2018-11-25 NOTE — Discharge Instructions (Signed)
Elevate your left leg as much as possible on 3 pillows. Ambulate nonweightbearing on the left lower extremity with a walker. You may move your toes as tolerated. Keep splint/dressing clean and dry.

## 2018-11-25 NOTE — Plan of Care (Signed)
initiated

## 2018-11-25 NOTE — Progress Notes (Signed)
Unable to tolerate mask, removed and deep breathing encouraged. Very emotional

## 2018-11-25 NOTE — Plan of Care (Signed)
Plan of care initiated.

## 2018-11-25 NOTE — Transfer of Care (Signed)
Immediate Anesthesia Transfer of Care Note  Patient: Linda Bradshaw  Procedure(s) Performed: OPEN REDUCTION INTERNAL FIXATION (ORIF) ANKLE FRACTURE (Left Ankle)  Patient Location: PACU  Anesthesia Type:General  Level of Consciousness: awake, alert , oriented and patient cooperative  Airway & Oxygen Therapy: Patient Spontanous Breathing and Patient connected to face mask oxygen  Post-op Assessment: Report given to RN, Post -op Vital signs reviewed and stable and Patient moving all extremities  Post vital signs: Reviewed and stable  Last Vitals:  Vitals Value Taken Time  BP    Temp    Pulse 85 11/25/18 1617  Resp 11 11/25/18 1617  SpO2 100 % 11/25/18 1617  Vitals shown include unvalidated device data.  Last Pain:  Vitals:   11/25/18 0912  TempSrc:   PainSc: 3       Patients Stated Pain Goal: 3 (01/24/11 1975)  Complications: No apparent anesthesia complications

## 2018-11-25 NOTE — Op Note (Signed)
NAME: Linda Bradshaw, [name] MEDICAL RECORD [id number] ACCOUNT [id number] DATE OF BIRTH:1996-01-26 FACILITY[facility] LOCATION: [facility] PHYSICIAN:Gianni Fuchs L. Baird Polinski, MD  OPERATIVE REPORT  DATE OF PROCEDURE:  11/25/2018  PREOPERATIVE DIAGNOSES:  Weber C ankle fracture with mortise displacement, left ankle grade I open.  POSTOPERATIVE DIAGNOSES:  Weber C ankle fracture with mortise displacement, left ankle grade I open.  PROCEDURE: 1.  Open reduction internal fixation of lateral malleolus fracture with a 9-hole dynamic compression plate. 2.  Open reduction internal fixation of mortise left ankle with 2 ZipTight ZipLoops. 3.  Interpretation of multiple intraoperative fluoroscopic images. 4.  Excision of skin, subcutaneous tissue, muscle and fascia at the site of an open fracture. 5.  Interpretation of multiple intraoperative fluoroscopic images.    SURGEON:  Jodi GeraldsJohn Vaun Hyndman, MD  ASSISTANOrma Flaming:  [place]  ANESTHESIA:  General.  BRIEF HISTORY:  The patient is a 23 year old female with a history of having fallen off a curb.  She suffered an ankle fracture.  She was seen in the emergency room and noted to have a Weber C ankle fracture with mortise displacement.  I initially had  asked that we put in a splint and sent to my office.  Upon examining her, unfortunately, she had a small open wound on the medial side which was continuously bleeding, and it was clear that this was in communication in some way with the fracture.  It was  a very small wound.  I felt it was reasonable to admit her overnight with IV antibiotics and fix her in the morning, and this is what was planned.  DESCRIPTION OF PROCEDURE:  The patient was taken to the operating room.  After adequate anesthesia was obtained with a general anesthetic, the patient was placed supine on the operating table.  The left leg was prepped and draped in the usual sterile  fashion.  Following this, the leg was exsanguinated, blood pressure tourniquet inflated  to 350 mmHg.  Following this, a small open incision was excised, excising skin, subcutaneous tissue, muscle and fascia at the site of an open fracture.  This was  debrided of subcutaneous tissue and then irrigated thoroughly at this level.  Once this was done, attention was turned laterally where the length of the incision was made over the lateral malleolus, incorporating the fracture and down to the joint line.   The fracture was completely exposed, cleansed of healing elements, and then anatomically reduced.  A 9-hole plate was chosen.  We made sure that this was going to get 3 holes above 6 cortices and then also get down to the joint where we could put our  ZipLoops through the plate.  Once this was chosen, we then put a single compression hole up top, and then once we were satisfied with the position of the plate, I then took a guide in compression mode to compress the fibular fracture.  We then filled the  remaining screw holes, leaving the one directly over the fracture site open.  This gave excellent fixation of the fracture.  We then went down and through the medial wound, which had been opened, we then took a Therapist, nutritionalreer elevator and swept any of the deep deltoid out of the medial joint space.  We then passed 2 ZipLoops parallel to the joint under direct vision and out the medial  wound, which we had created.  We then backed this up and seated the implants medially.  Then once we were satisfied with that, we held the foot in maximum  dorsiflexion and tightened down the ZipLoops and then tied these in the lateral compartment.  We  then took our final fluoroscopic images, and before tightening, we had examined the ankle.  We had excellent reduction of the mortise and tied and tightened these sutures.  Once it was completed, we irrigated the wound thoroughly and then closed the  medial wound just with skin sutures because the skin was a little tenuous over the area where we had excised the small open  wound.  We then irrigated the lateral side and then closed it in layers.  A sterile compressive dressing was applied as well as a  U and a posterior splint, and the patient taken to recovery.  She was noted to be in satisfactory condition.  Estimated blood loss for the procedure was minimal.  The patient will be discharged home from the recovery room if she is able to clear crutch  training.  LN/NUANCE  D:11/25/2018 T:11/25/2018 JOB:[id number]

## 2018-11-25 NOTE — Progress Notes (Signed)
Subjective: Painful l ankle   Objective: Vital signs in last 24 hours: Temp:  [97.7 F (36.5 C)-98.4 F (36.9 C)] 97.7 F (36.5 C) (08/08 0501) Pulse Rate:  [67-83] 75 (08/08 0501) Resp:  [12-16] 16 (08/08 0501) BP: (115-131)/(63-75) 115/71 (08/08 0501) SpO2:  [97 %-100 %] 100 % (08/08 0501) Weight:  [136.1 kg] 136.1 kg (08/08 0353)  Intake/Output from previous day: 08/07 0701 - 08/08 0700 In: 100 [IV Piggyback:100] Out: -  Intake/Output this shift: No intake/output data recorded.  Recent Labs    11/24/18 2240  HGB 10.9*   Recent Labs    11/24/18 2240  WBC 13.5*  RBC 4.00  HCT 33.9*  PLT 289   Recent Labs    11/24/18 2240  NA 138  K 3.4*  CL 108  CO2 21*  BUN 16  CREATININE 0.69  GLUCOSE 110*  CALCIUM 8.5*   No results for input(s): LABPT, INR in the last 72 hours.  Neurologically intact Sensation intact distally Intact pulses distally No cellulitis present Compartment soft    Assessment/Plan: orif today with post op d/c or admit overnight for additional iv abx Pt today     Alta Corning 11/25/2018, 8:49 AM

## 2018-11-25 NOTE — Discharge Summary (Signed)
Patient ID: Linda Bradshaw MRN: [id number] DOB/AGE: [birth year] 23 y.o.  Admit date: 11/24/2018 Discharge date: 11/25/2018  Admission Diagnoses:  Principal Problem:   Open left fibular fracture   Discharge Diagnoses:  Same  History reviewed. No pertinent past medical history.  Surgeries: Procedure(s): Left OPEN REDUCTION INTERNAL FIXATION (ORIF) ANKLE FRACTURE on 11/25/2018   Consultants:   Discharged Condition: Improved  Hospital Course: Linda Bradshaw is an 23 y.o. female who was admitted 11/24/2018 for operative treatment ofOpen left fibular fracture with widening of the medial mortise.. Patient has severe unremitting pain that affects sleep, daily activities, and work/hobbies. After pre-op clearance the patient was taken to the operating room on 11/25/2018 and underwent  Procedure(s): Left OPEN REDUCTION INTERNAL FIXATION (ORIF) ANKLE FRACTURE.    Patient was given perioperative antibiotics:  Anti-infectives (From admission, onward)   Start     Dose/Rate Route Frequency Ordered Stop   11/25/18 1433  polymyxin B 500,000 Units, bacitracin 50,000 Units in sodium chloride 0.9 % 500 mL irrigation  Status:  Discontinued       As needed 11/25/18 1439 11/25/18 1611   11/25/18 1401  ceFAZolin (ANCEF) 2-4 GM/100ML-% IVPB    Note to Pharmacy: Steffanie DunnEdathil, [place]   : cabinet override      11/25/18 1401 11/26/18 0214   11/25/18 0000  cephALEXin (KEFLEX) 500 MG capsule     500 mg Oral 3 times daily 11/25/18 1619     11/24/18 2245  ceFAZolin (ANCEF) IVPB 2g/100 mL premix     2 g 200 mL/hr over 30 Minutes Intravenous  Once 11/24/18 2240 11/25/18 0209       Patient was given sequential compression devices, early ambulation, and chemoprophylaxis to prevent DVT.  She was admitted and given IV antibiotics due to the open nature of her left ankle fracture.  She was then taken to the operating room on 11/25/2018 and underwent surgery.  Postoperatively she was discharged home and instructed to be  nonweightbearing on the left lower extremity with crutches.  She is instructed to elevate the leg as much as possible.  She was sent home on Keflex 500 mg 3 times daily for 5 days.  Patient benefited maximally from hospital stay and there were no complications.    Recent vital signs:  Patient Vitals for the past 24 hrs:  BP Temp Temp src Pulse Resp SpO2 Height Weight  11/25/18 1715 131/68 98.6 F (37 C) Oral 81 16 96 % - -  11/25/18 1700 (!) 132/57 98.6 F (37 C) - 86 12 100 % - -  11/25/18 1645 (!) 135/52 - - 83 13 99 % - -  11/25/18 1630 128/61 - - 94 15 98 % - -  11/25/18 1616 120/60 98.4 F (36.9 C) - 86 11 100 % - -  11/25/18 0501 115/71 97.7 F (36.5 C) Oral 75 16 100 % - -  11/25/18 0353 - - - - - - 5\' 7"  (1.702 m) (!) 136.1 kg  11/25/18 0242 131/75 98.1 F (36.7 C) Oral 74 16 99 % - -  11/25/18 0030 121/63 - - 67 14 97 % - -  11/24/18 1931 129/66 98.4 F (36.9 C) Oral 83 12 100 % 5\' 7"  (1.702 m) -  11/24/18 1927 - - - - - 97 % - -     Recent laboratory studies:  Recent Labs    11/24/18 2240  WBC 13.5*  HGB 10.9*  HCT 33.9*  PLT 289  NA 138  K  3.4*  CL 108  CO2 21*  BUN 16  CREATININE 0.69  GLUCOSE 110*  CALCIUM 8.5*     Discharge Medications:   Allergies as of 11/25/2018   No Known Allergies     Medication List    STOP taking these medications   ibuprofen 200 MG tablet Commonly known as: ADVIL   ibuprofen 800 MG tablet Commonly known as: ADVIL   lidocaine 2 % solution Commonly known as: XYLOCAINE     TAKE these medications   cephALEXin 500 MG capsule Commonly known as: Keflex Take 1 capsule (500 mg total) by mouth 3 (three) times daily.   levonorgestrel 20 MCG/24HR IUD Commonly known as: MIRENA 1 Intra Uterine Device by Intrauterine route once.   oxyCODONE-acetaminophen 5-325 MG tablet Commonly known as: PERCOCET/ROXICET Take 1-2 tablets by mouth every 6 (six) hours as needed for severe pain.            Discharge Care Instructions   (From admission, onward)         Start     Ordered   11/25/18 0000  Non weight bearing    Question Answer Comment  Laterality left   Extremity Lower      11/25/18 1619          Diagnostic Studies: Dg Tibia/fibula Left  Result Date: 11/24/2018 CLINICAL DATA:  Injury EXAM: LEFT TIBIA AND FIBULA - 2 VIEW COMPARISON:  None. FINDINGS: Acute mildly displaced distal tibial fibular diaphyseal fracture. Mid and proximal tibia and fibula appear intact. IMPRESSION: Proximal and mid tibia and fibula appear intact. See separately dictated ankle report Electronically Signed   By: Donavan Foil M.D.   On: 11/24/2018 20:25   Dg Ankle Complete Left  Result Date: 11/24/2018 CLINICAL DATA:  Deformity, fall EXAM: LEFT ANKLE COMPLETE - 3+ VIEW COMPARISON:  None. FINDINGS: Acute fracture involving the distal shaft of the fibula at the junction of the middle and distal thirds. Less than 1/4 bone with lateral displacement of distal fracture fragment. Multiple osseous fragment posterior to the ankle, possibly posterior malleolar fracture fragments. Disruption of the ankle mortise which is widened medially. There is mild widening of the lateral clear space. IMPRESSION: 1. Acute mildly displaced fracture involving the distal shaft of the fibula at the junction of the middle and distal thirds. 2. Multiple fracture fragments posterior to the ankle on lateral view, possibly from the posterior malleolus. Disruption of the ankle mortise with widening of the medial and lateral clear space. Electronically Signed   By: Donavan Foil M.D.   On: 11/24/2018 20:24    Disposition: Discharge disposition: 01-Home or Self Care       Discharge Instructions    Call MD / Call 911   Complete by: As directed    If you experience chest pain or shortness of breath, CALL 911 and be transported to the hospital emergency room.  If you develope a fever above 101 F, pus (white drainage) or increased drainage or redness at the wound, or  calf pain, call your surgeon's office.   Constipation Prevention   Complete by: As directed    Drink plenty of fluids.  Prune juice may be helpful.  You may use a stool softener, such as Colace (over the counter) 100 mg twice a day.  Use MiraLax (over the counter) for constipation as needed.   Diet general   Complete by: As directed    Increase activity slowly as tolerated   Complete by: As directed  Non weight bearing   Complete by: As directed    Laterality: left   Extremity: Lower   Walker standard   Complete by: As directed     Instead of a walker the patient reported that she could use crutches.  Follow-up Information    Jodi GeraldsGraves, John, MD. Schedule an appointment as soon as possible for a visit in 2 weeks.   Specialty: Orthopedic Surgery Contact information: [address] [place] [address] [phone]            Signed: Matthew FolksJames G Abbie Berling 11/25/2018, 5:45 PM

## 2018-11-25 NOTE — Progress Notes (Addendum)
Graves gave verbal orders for oxy IR PO 5-10 mg, 1-2 tablets PRN Q-4  for pain. Reglan IV 10 mg for nausea PRN Glena Norfolk, MD indicated that the pt can stay the night, if needed. Notify MD if pt requires an overnight stay, per Graves.

## 2018-11-25 NOTE — Progress Notes (Signed)
The pt and pt's family member was provided with d/c instructions. After discussing the pt's plan of care upon d/c home, the pt and pt's family member reported no further questions or concerns. Pt requested to take a short nap prior to leaving. Crutches delivered to pt. Family member at the bedside.

## 2018-11-25 NOTE — ED Notes (Signed)
ED TO INPATIENT HANDOFF REPORT  ED Nurse Name and Phone #: Almond LintZach Shannon Balthazar  S Name/Age/Gender Linda Bradshaw 23 y.o. female Room/Bed: WA16/WA16  Code Status   Code Status: Not on file  Home/SNF/Other Given to floor Patient oriented to: self, place, time and situation Is this baseline? Yes   Triage Complete: Triage complete  Chief Complaint Ankle Injury  Triage Note Per EMS, Pt was walking down a set of steps and landed wrong. Pt has a obvious deformity, and instability of left ankle. Small laceration on left lateral side of ankle. Pt states she saw bone in wound, EMS did not observe any bone. Good pulses and cap refill, some numbness on lateral side of left leg. 150 mcg of fentanyl given in route to ED.    Allergies No Known Allergies  Level of Care/Admitting Diagnosis ED Disposition    ED Disposition Condition Comment   Admit  [facility]ospital [facility] [[id number]]  Level of Care: Med-Surg [16]  Covid Evaluation: Asymptomatic Screening Protocol (No Symptoms)  Diagnosis: Open left fibular fracture [951884][[id number]]  Admitting Physician: Jodi GeraldsGRAVES, JOHN [[id number]]  Attending Physician: Jodi GeraldsGRAVES, JOHN [[id number]]  Bed request comments: ortho  PT Class (Do Not Modify): Observation [104]  PT Acc Code (Do Not Modify): Observation [10022]       B Medical/Surgery History History reviewed. No pertinent past medical history. History reviewed. No pertinent surgical history.   A IV Location/Drains/Wounds Patient Lines/Drains/Airways Status   Active Line/Drains/Airways    Name:   Placement date:   Placement time:   Site:   Days:   Peripheral IV 11/24/18 Left Forearm   11/24/18    2324    Forearm   1          Intake/Output Last 24 hours  Intake/Output Summary (Last 24 hours) at 11/25/2018 [id number] Last data filed at 11/25/2018 0209 Gross per 24 hour  Intake 100 ml  Output -  Net 100 ml    Labs/Imaging Results for orders placed or performed during the hospital encounter  of 11/24/18 (from the past 48 hour(s))  SARS Coronavirus 2 [facility](Hospital order, Performed [facility]) Nasopharyngeal Nasopharyngeal Swab     Status: None   Collection Time: 11/24/18 10:40 PM   Specimen: Nasopharyngeal Swab  Result Value Ref Range   SARS Coronavirus 2 NEGATIVE NEGATIVE    Comment: (NOTE) If result is NEGATIVE SARS-CoV-2 target nucleic acids are NOT DETECTED. The SARS-CoV-2 RNA is generally detectable in upper and lower  respiratory specimens during the acute phase of infection. The lowest  concentration of SARS-CoV-2 viral copies this assay can detect is 250  copies / mL. A negative result does not preclude SARS-CoV-2 infection  and should not be used as the sole basis for treatment or other  patient management decisions.  A negative result may occur with  improper specimen collection / handling, submission of specimen other  than nasopharyngeal swab, presence of viral mutation(s) within the  areas targeted by this assay, and inadequate number of viral copies  (<250 copies / mL). A negative result must be combined with clinical  observations, patient history, and epidemiological information. If result is POSITIVE SARS-CoV-2 target nucleic acids are DETECTED. The SARS-CoV-2 RNA is generally detectable in upper and lower  respiratory specimens dur ing the acute phase of infection.  Positive  results are indicative of active infection with SARS-CoV-2.  Clinical  correlation with patient history and other diagnostic information is  necessary to determine patient infection status.  Positive results do  not rule out bacterial infection or co-infection with other viruses. If result is PRESUMPTIVE POSTIVE SARS-CoV-2 nucleic acids MAY BE PRESENT.   A presumptive positive result was obtained on the submitted specimen  and confirmed on repeat testing.  While 2019 novel coronavirus  (SARS-CoV-2) nucleic acids may be present in the submitted sample  additional  confirmatory testing may be necessary for epidemiological  and / or clinical management purposes  to differentiate between  SARS-CoV-2 and other Sarbecovirus currently known to infect humans.  If clinically indicated additional testing with an alternate test  methodology [phone]) is advised. The SARS-CoV-2 RNA is generally  detectable in upper and lower respiratory sp ecimens during the acute  phase of infection. The expected result is Negative. Fact Sheet for Patients:  StrictlyIdeas.no Fact Sheet for Healthcare Providers: BankingDealers.co.za This test is not yet approved or cleared by the [place] FDA and has been authorized for detection and/or diagnosis of SARS-CoV-2 by FDA under an Emergency Use Authorization (EUA).  This EUA will remain in effect (meaning this test can be used) for the duration of the COVID-19 declaration under Section 564(b)(1) of the Act, 21 U.S.C. section 360bbb-3(b)(1), unless the authorization is terminated or revoked sooner. Performed at [facility], [place] [address], [place], [place] [address]   Basic metabolic panel     Status: Abnormal   Collection Time: 11/24/18 10:40 PM  Result Value Ref Range   Sodium 138 135 - 145 mmol/L   Potassium 3.4 (L) 3.5 - 5.1 mmol/L   Chloride 108 98 - 111 mmol/L   CO2 21 (L) 22 - 32 mmol/L   Glucose, Bld 110 (H) 70 - 99 mg/dL   BUN 16 6 - 20 mg/dL   Creatinine, Ser 0.69 0.44 - 1.00 mg/dL   Calcium 8.5 (L) 8.9 - 10.3 mg/dL   GFR calc non Af Amer >60 >60 mL/min   GFR calc Af Amer >60 >60 mL/min   Anion gap 9 5 - 15    Comment: Performed at [facility], [place] [address] [place], [place] [address]  CBC with Differential     Status: Abnormal   Collection Time: 11/24/18 10:40 PM  Result Value Ref Range   WBC 13.5 (H) 4.0 - 10.5 K/uL   RBC 4.00 3.87 - 5.11 MIL/uL   Hemoglobin 10.9 (L) 12.0 - 15.0 g/dL   HCT 33.9 (L) 36.0 - 46.0 %    MCV 84.8 80.0 - 100.0 fL   MCH 27.3 26.0 - 34.0 pg   MCHC 32.2 30.0 - 36.0 g/dL   RDW 12.5 11.5 - 15.5 %   Platelets 289 150 - 400 K/uL   nRBC 0.0 0.0 - 0.2 %   Neutrophils Relative % 76 %   Neutro Abs 10.1 (H) 1.7 - 7.7 K/uL   Lymphocytes Relative 19 %   Lymphs Abs 2.6 0.7 - 4.0 K/uL   Monocytes Relative 5 %   Monocytes Absolute 0.7 0.1 - 1.0 K/uL   Eosinophils Relative 0 %   Eosinophils Absolute 0.0 0.0 - 0.5 K/uL   Basophils Relative 0 %   Basophils Absolute 0.0 0.0 - 0.1 K/uL   Immature Granulocytes 0 %   Abs Immature Granulocytes 0.04 0.00 - 0.07 K/uL    Comment: Performed at [facility], [place] [address], [place], [place] [address]   Dg Tibia/fibula Left  Result Date: 11/24/2018 CLINICAL DATA:  Injury EXAM: LEFT TIBIA AND FIBULA - 2 VIEW COMPARISON:  None. FINDINGS: Acute mildly displaced distal tibial fibular diaphyseal fracture. Mid and proximal tibia and fibula appear intact. IMPRESSION: Proximal and mid tibia and fibula appear intact. See separately dictated ankle report Electronically Signed   By: Jasmine PangKim  Fujinaga M.D.   On: 11/24/2018 20:25   Dg Ankle Complete Left  Result Date: 11/24/2018 CLINICAL DATA:  Deformity, fall EXAM: LEFT ANKLE COMPLETE - 3+ VIEW COMPARISON:  None. FINDINGS: Acute fracture involving the distal shaft of the fibula at the junction of the middle and distal thirds. Less than 1/4 bone with lateral displacement of distal fracture fragment. Multiple osseous fragment posterior to the ankle, possibly posterior malleolar fracture fragments. Disruption of the ankle mortise which is widened medially. There is mild widening of the lateral clear space. IMPRESSION: 1. Acute mildly displaced fracture involving the distal shaft of the fibula at the junction of the middle and distal thirds. 2. Multiple fracture fragments posterior to the ankle on lateral view, possibly from the posterior malleolus. Disruption of the ankle mortise with widening of the  medial and lateral clear space. Electronically Signed   By: Jasmine PangKim  Fujinaga M.D.   On: 11/24/2018 20:24    Pending Labs Unresulted Labs (From admission, onward)   None      Vitals/Pain Today's Vitals   11/24/18 1936 11/24/18 2321 11/25/18 0030 11/25/18 0209  BP:   121/63   Pulse:   67   Resp:   14   Temp:      TempSrc:      SpO2:   97%   Height:      PainSc: 10-Worst pain ever 10-Worst pain ever  10-Worst pain ever    Isolation Precautions No active isolations  Medications Medications  HYDROmorphone (DILAUDID) injection 0.5-1 mg (has no administration in time range)  Tdap (BOOSTRIX) injection 0.5 mL (0.5 mLs Intramuscular Given 11/24/18 2103)  oxyCODONE-acetaminophen (PERCOCET/ROXICET) 5-325 MG per tablet 1 tablet (1 tablet Oral Given 11/24/18 2101)  ceFAZolin (ANCEF) IVPB 2g/100 mL premix (0 g Intravenous Stopped 11/25/18 0209)  HYDROmorphone (DILAUDID) injection 1 mg (1 mg Intravenous Given 11/24/18 2322)    Mobility walks with device Low fall risk   Focused Assessments Left leg   R Recommendations: See Admitting Provider Note  Report given to:   Additional Notes: Pedal pulse marked.

## 2018-11-25 NOTE — Progress Notes (Signed)
PT Cancellation Note  Patient Details Name: Linda Bradshaw MRN: [id number] DOB: [birth year]   Cancelled Treatment:    Reason Eval/Treat Not Completed: Patient at procedure or test/unavailable; unable to see pt earlier this am d/t a plethora of imminent d/c orders. Attempted just now and pt going for surgery. Will see in am tomorrow   [facility] 11/25/2018, 1:39 PM

## 2018-11-25 NOTE — Anesthesia Postprocedure Evaluation (Signed)
Anesthesia Post Note  Patient: Linda Bradshaw  Procedure(s) Performed: OPEN REDUCTION INTERNAL FIXATION (ORIF) ANKLE FRACTURE (Left Ankle)     Patient location during evaluation: PACU Anesthesia Type: General Level of consciousness: awake Pain management: pain level controlled Vital Signs Assessment: post-procedure vital signs reviewed and stable Respiratory status: spontaneous breathing Cardiovascular status: stable Postop Assessment: no apparent nausea or vomiting Anesthetic complications: no    Last Vitals:  Vitals:   11/25/18 0501 11/25/18 1616  BP: 115/71 120/60  Pulse: 75 86  Resp: 16 11  Temp: 36.5 C   SpO2: 100% 100%    Last Pain:  Vitals:   11/25/18 0912  TempSrc:   PainSc: 3                  Ashyr Hedgepath

## 2018-11-25 NOTE — Anesthesia Preprocedure Evaluation (Addendum)
Anesthesia Evaluation  Patient identified by MRN, date of birth, ID band Patient awake    Reviewed: Allergy & Precautions, NPO status , Patient's Chart, lab work & pertinent test results  Airway Mallampati: II  TM Distance: >3 FB     Dental   Pulmonary    breath sounds clear to auscultation       Cardiovascular negative cardio ROS   Rhythm:Regular Rate:Normal     Neuro/Psych    GI/Hepatic negative GI ROS,   Endo/Other  negative endocrine ROS  Renal/GU negative Renal ROS     Musculoskeletal   Abdominal   Peds  Hematology   Anesthesia Other Findings   Reproductive/Obstetrics                            Anesthesia Physical Anesthesia Plan  ASA: I and emergent  Anesthesia Plan: General   Post-op Pain Management:    Induction: Intravenous  PONV Risk Score and Plan: 3 and Ondansetron, Dexamethasone and Midazolam  Airway Management Planned: LMA  Additional Equipment:   Intra-op Plan:   Post-operative Plan:   Informed Consent: I have reviewed the patients History and Physical, chart, labs and discussed the procedure including the risks, benefits and alternatives for the proposed anesthesia with the patient or authorized representative who has indicated his/her understanding and acceptance.     Dental advisory given  Plan Discussed with: CRNA and Anesthesiologist  Anesthesia Plan Comments:         Anesthesia Quick Evaluation

## 2018-11-25 NOTE — Progress Notes (Signed)
Graves, MD was called regarding the order for the pt to be d/c home today. Per MD, the pt can go home once pt arrives to the floor and is taught and able to use crutches.

## 2018-11-25 NOTE — Anesthesia Postprocedure Evaluation (Signed)
Anesthesia Post Note  Patient: Linda Bradshaw  Procedure(s) Performed: OPEN REDUCTION INTERNAL FIXATION (ORIF) ANKLE FRACTURE (Left Ankle)     Patient location during evaluation: PACU Anesthesia Type: General Level of consciousness: awake Pain management: pain level controlled Vital Signs Assessment: post-procedure vital signs reviewed and stable Respiratory status: spontaneous breathing Cardiovascular status: stable Postop Assessment: no apparent nausea or vomiting Anesthetic complications: no    Last Vitals:  Vitals:   11/25/18 1700 11/25/18 1715  BP: (!) 132/57 131/68  Pulse: 86 81  Resp: 12 16  Temp: 37 C 37 C  SpO2: 100% 96%    Last Pain:  Vitals:   11/25/18 1841  TempSrc:   PainSc: 2                  Karcyn Menn

## 2018-11-25 NOTE — Progress Notes (Signed)
[employer] provided crutches to the pt and educated the pt regarding the use of the crutches while maintaining no weight on the operative leg.

## 2018-11-25 NOTE — Brief Op Note (Signed)
11/24/2018 - 11/25/2018  3:24 PM  PATIENT:  Linda Bradshaw  23 y.o. female  PRE-OPERATIVE DIAGNOSIS:  left ankle fracture  POST-OPERATIVE DIAGNOSIS:  left ankle fracture  PROCEDURE:  Procedure(s): OPEN REDUCTION INTERNAL FIXATION (ORIF) ANKLE FRACTURE (Left)  SURGEON:  Surgeon(s) and Role:    Dorna Leitz, MD - Primary  PHYSICIAN ASSISTANT:   ASSISTANTS: bethune    ANESTHESIA:   general  EBL:  none   BLOOD ADMINISTERED:none  DRAINS: none   LOCAL MEDICATIONS USED:  MARCAINE     SPECIMEN:  No Specimen  DISPOSITION OF SPECIMEN:  N/A  COUNTS:  YES  TOURNIQUET:  * Missing tourniquet times found for documented tourniquets in log: [id number] *  DICTATION: .Other Dictation: Dictation Number [id number]  PLAN OF CARE: Discharge to home after PACU  PATIENT DISPOSITION:  PACU - hemodynamically stable.   Delay start of Pharmacological VTE agent (>24hrs) due to surgical blood loss or risk of bleeding: no

## 2018-11-25 NOTE — Anesthesia Procedure Notes (Addendum)
Procedure Name: Intubation Date/Time: 11/25/2018 2:18 PM Performed by: Mitzie Na, CRNA Pre-anesthesia Checklist: Patient identified, Emergency Drugs available, Suction available and Patient being monitored Patient Re-evaluated:Patient Re-evaluated prior to induction Oxygen Delivery Method: Circle system utilized Preoxygenation: Pre-oxygenation with 100% oxygen Induction Type: IV induction Ventilation: Mask ventilation without difficulty Laryngoscope Size: Glidescope and 3 Tube type: Oral Tube size: 7.5 mm Number of attempts: 1 Airway Equipment and Method: Oral airway and Video-laryngoscopy Placement Confirmation: ETT inserted through vocal cords under direct vision,  positive ETCO2 and breath sounds checked- equal and bilateral Secured at: 24 cm Tube secured with: Tape Dental Injury: Teeth and Oropharynx as per pre-operative assessment

## 2018-11-27 ENCOUNTER — Encounter (HOSPITAL_COMMUNITY): Payer: Self-pay | Admitting: Orthopedic Surgery

## 2019-06-27 DIAGNOSIS — S82899A Other fracture of unspecified lower leg, initial encounter for closed fracture: Secondary | ICD-10-CM

## 2019-06-27 HISTORY — DX: Other fracture of unspecified lower leg, initial encounter for closed fracture: S82.899A

## 2020-08-19 IMAGING — CR LEFT TIBIA AND FIBULA - 2 VIEW
2 series · 2 of 2 positions shown · non-contrast
Comparison: None.

CLINICAL DATA: Injury

EXAM:
LEFT TIBIA AND FIBULA - 2 VIEW

[x tib-fib lat left]
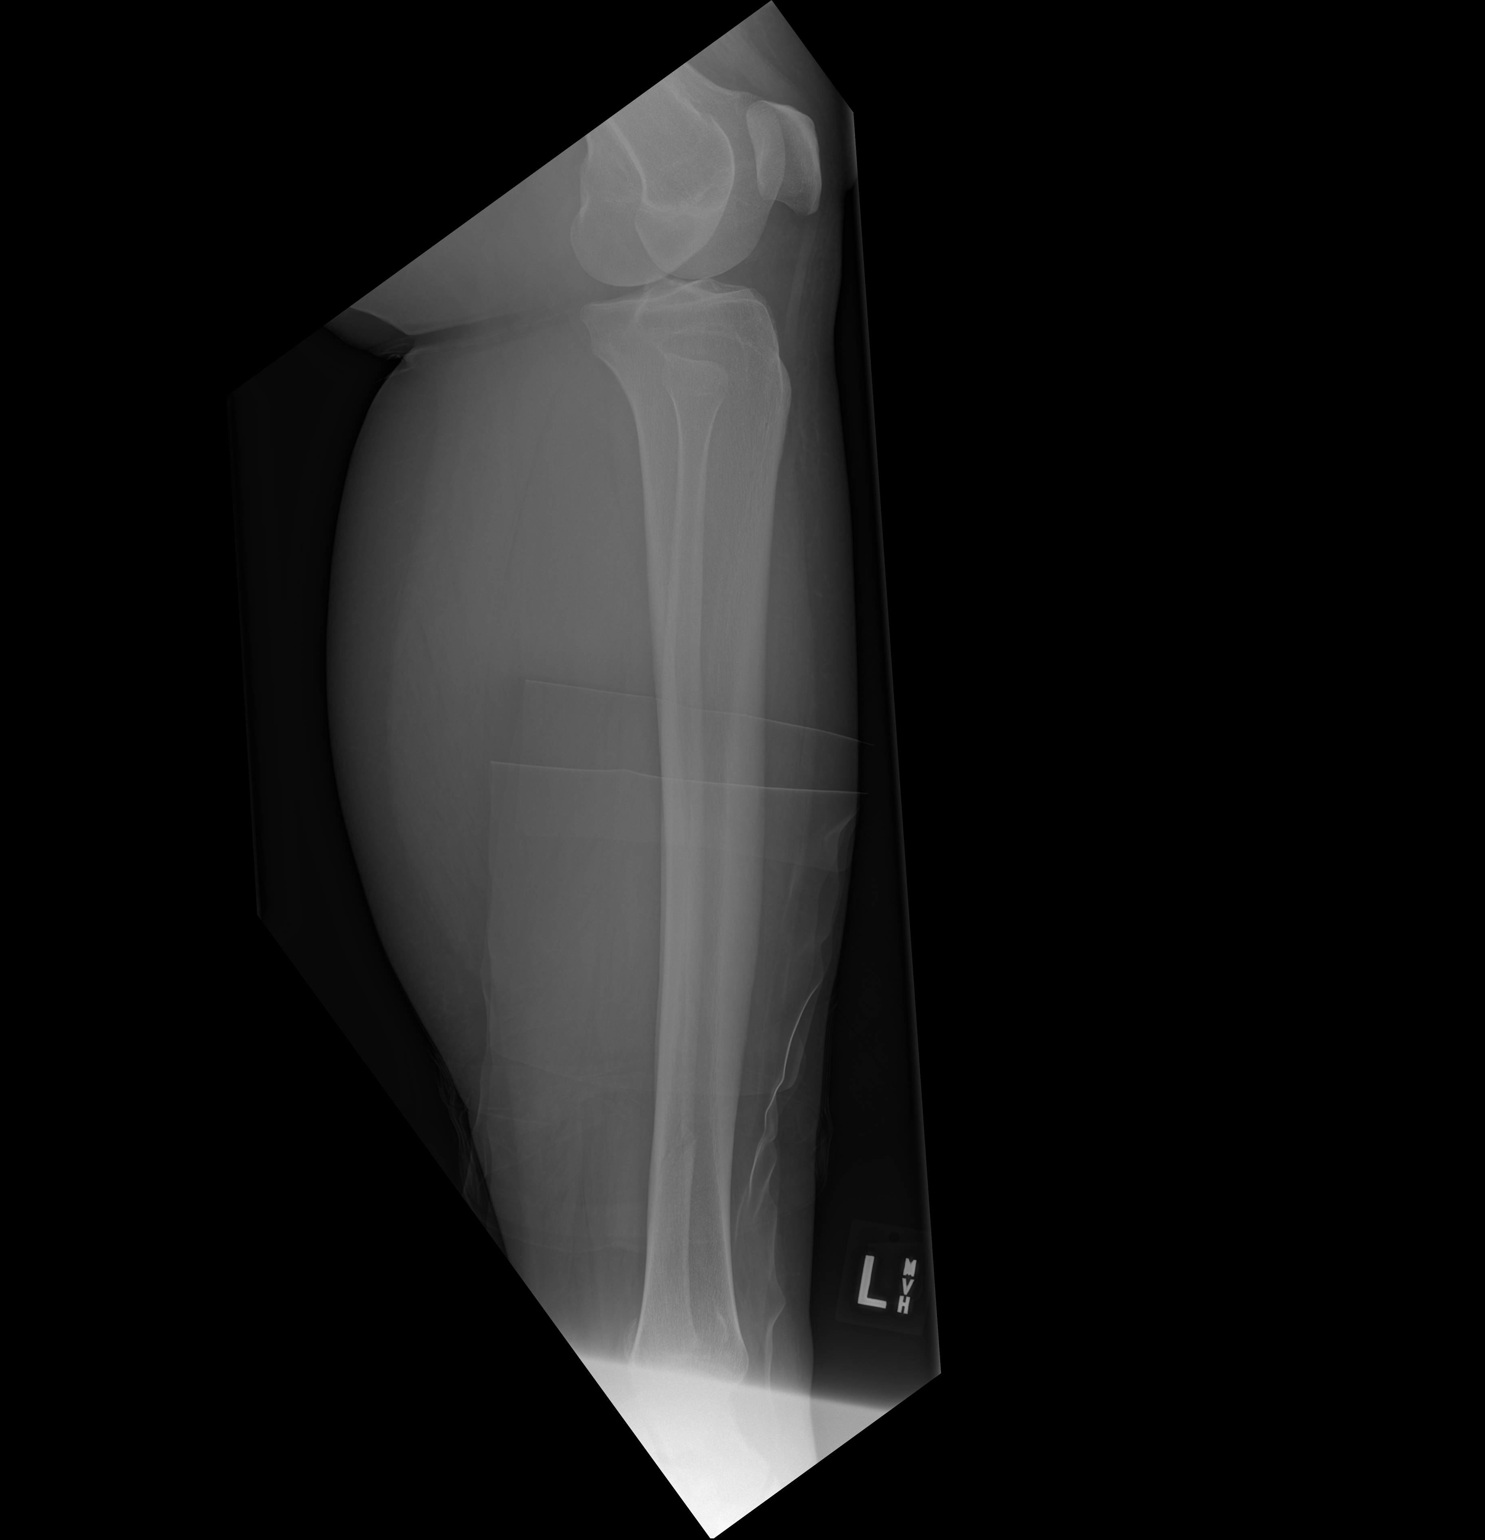

[x tib-fib ap left]
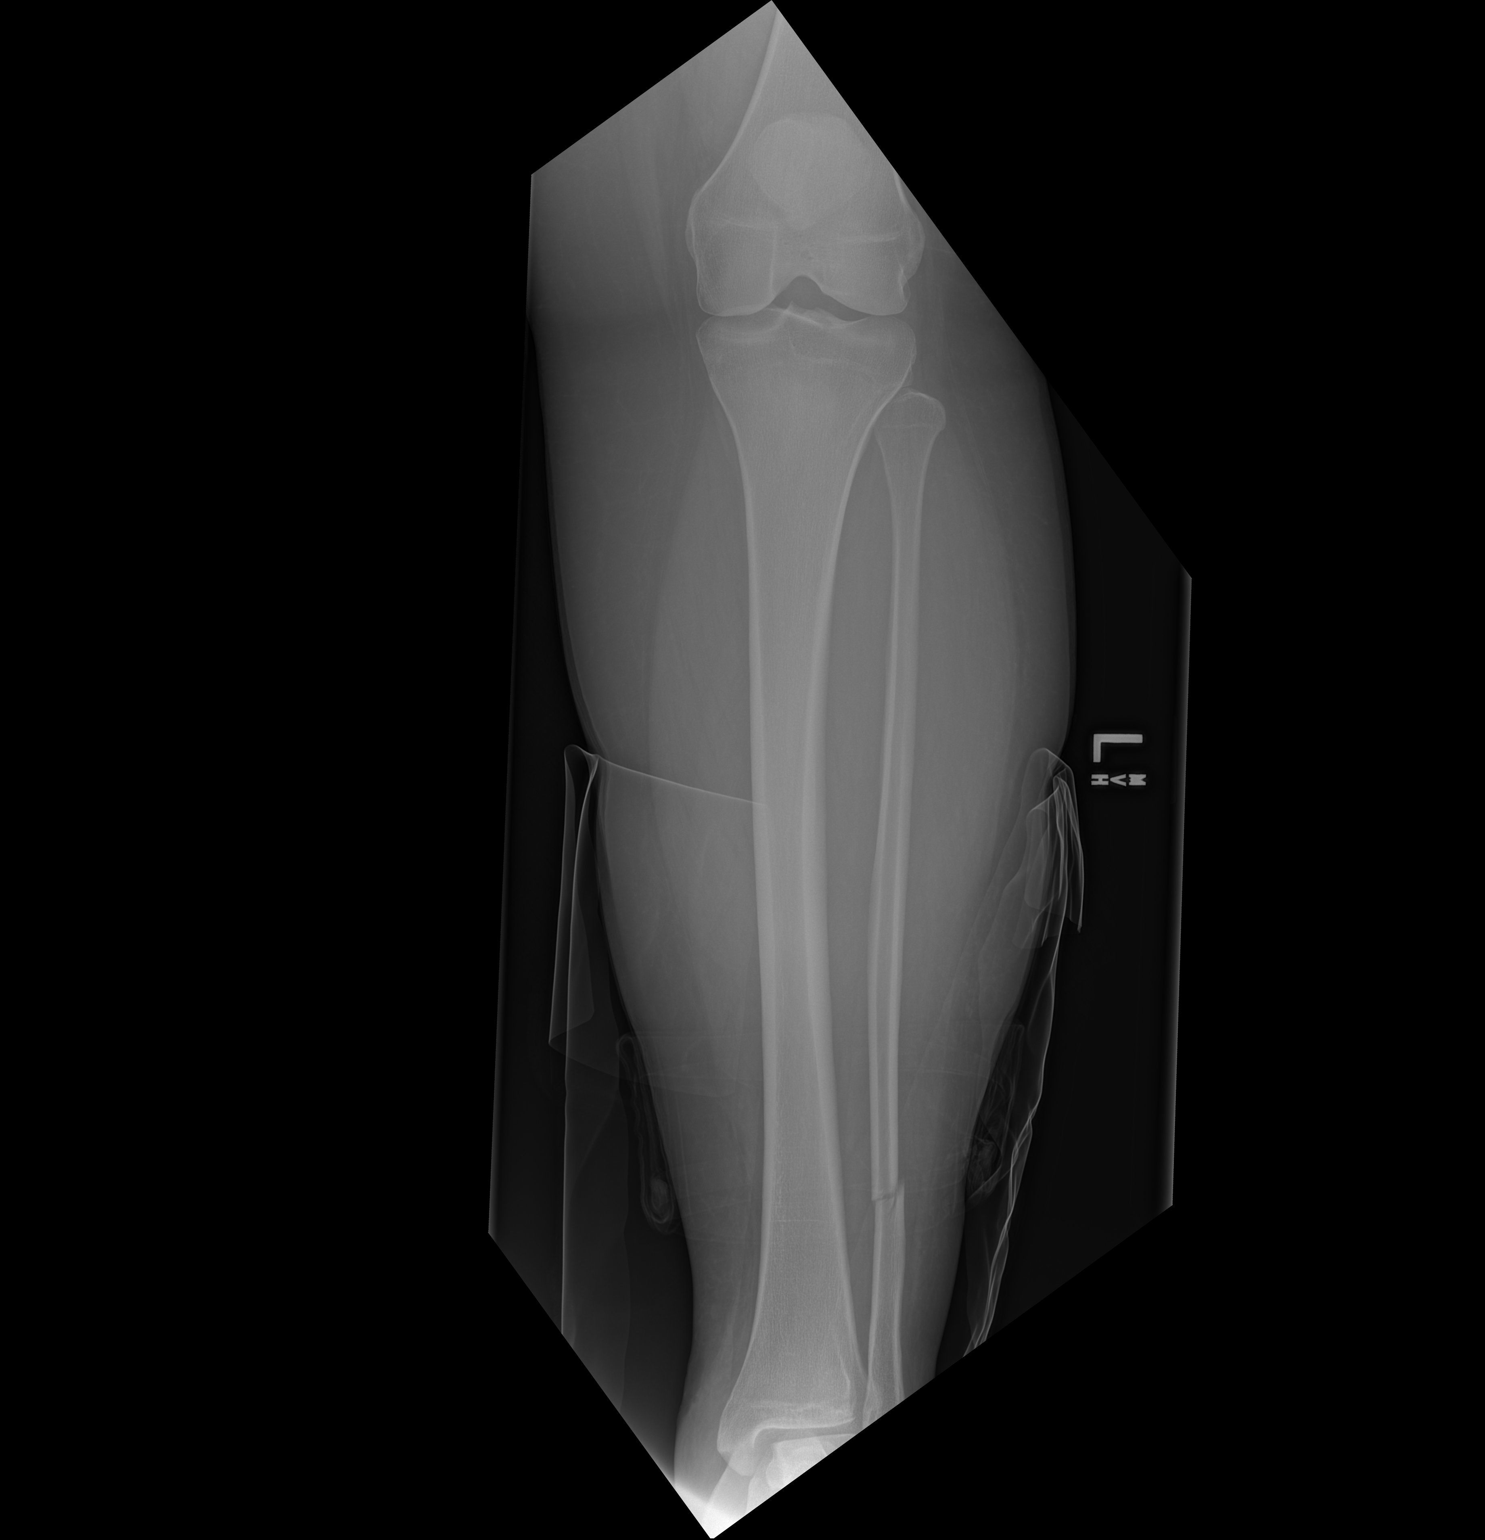

[2 of 2 positions shown; findings below may reference images not displayed]

FINDINGS: Acute mildly displaced distal tibial fibular diaphyseal fracture.
Mid and proximal tibia and fibula appear intact.
IMPRESSION: Proximal and mid tibia and fibula appear intact. See separately
dictated ankle report

## 2020-08-19 IMAGING — CR LEFT ANKLE COMPLETE - 3+ VIEW
3 series · 3 of 3 positions shown · non-contrast
Comparison: None.

CLINICAL DATA: Deformity, fall

EXAM:
LEFT ANKLE COMPLETE - 3+ VIEW

[x ankle ap left (1 of 2)]
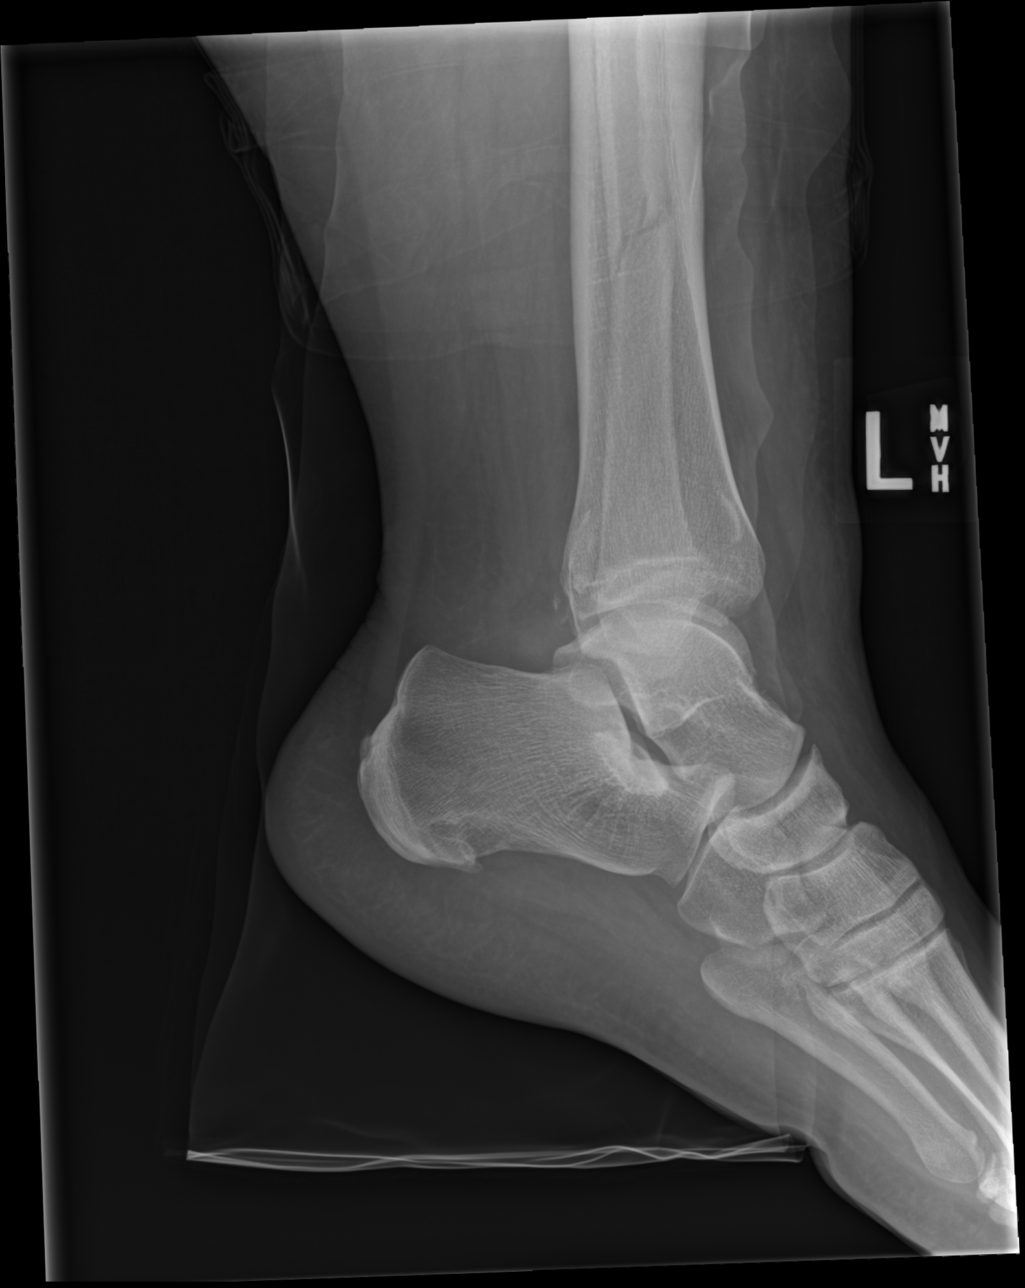

[x ankle ap left (2 of 2)]
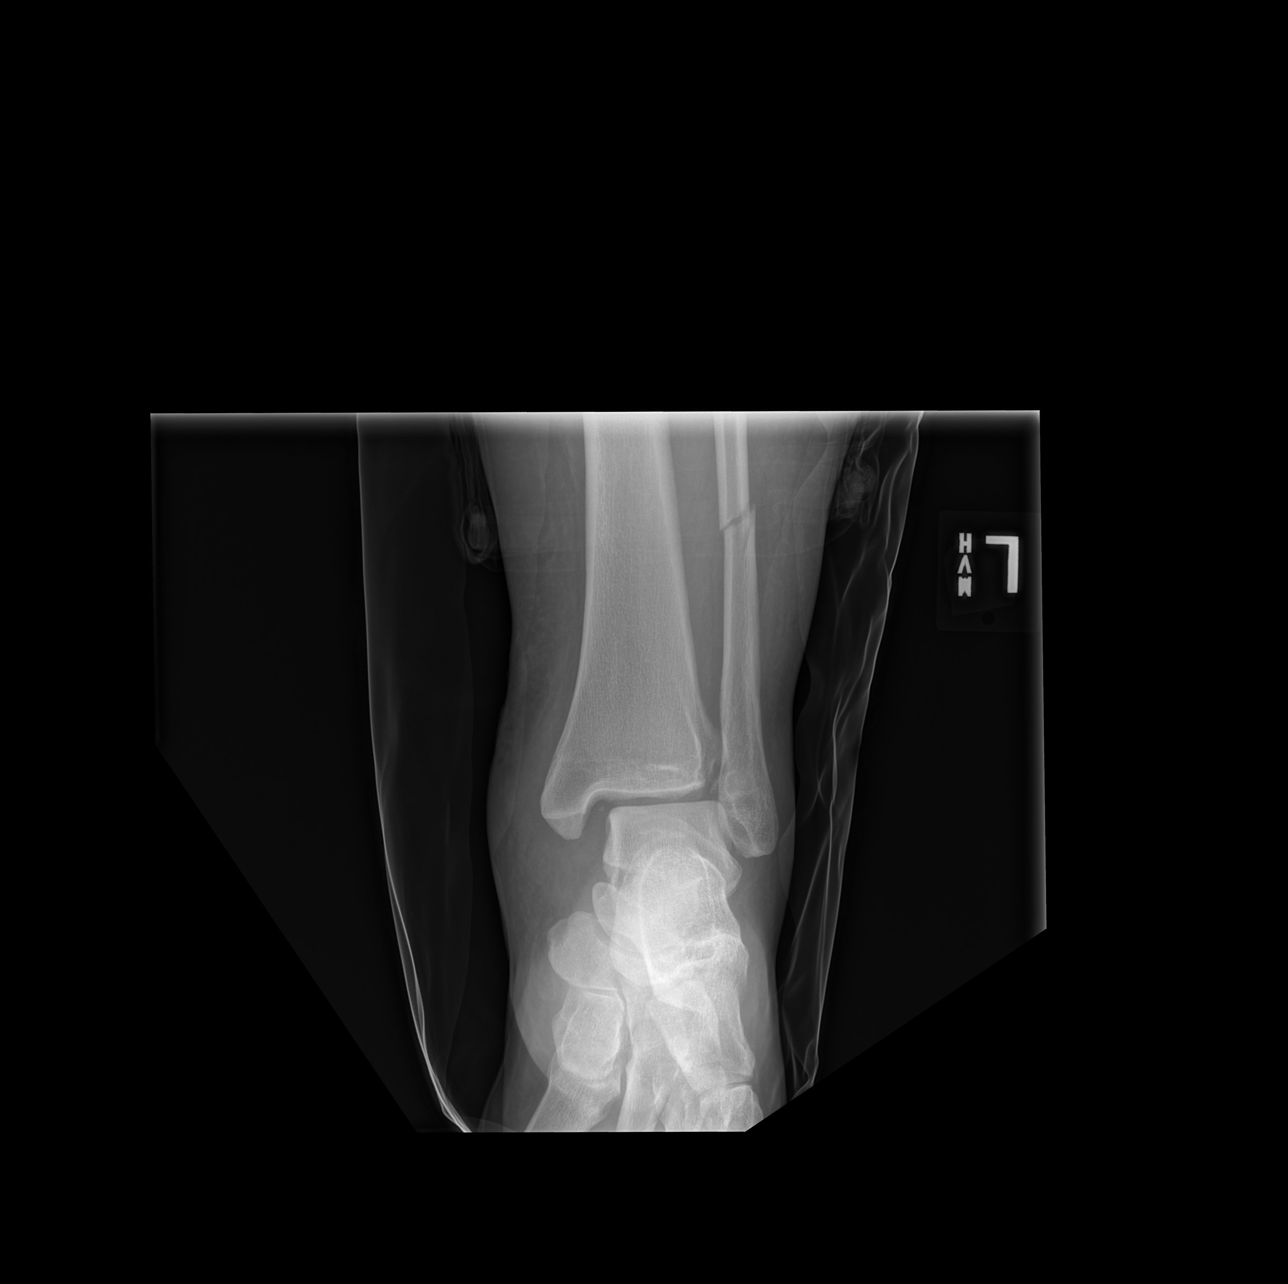

[x ankle obl left]
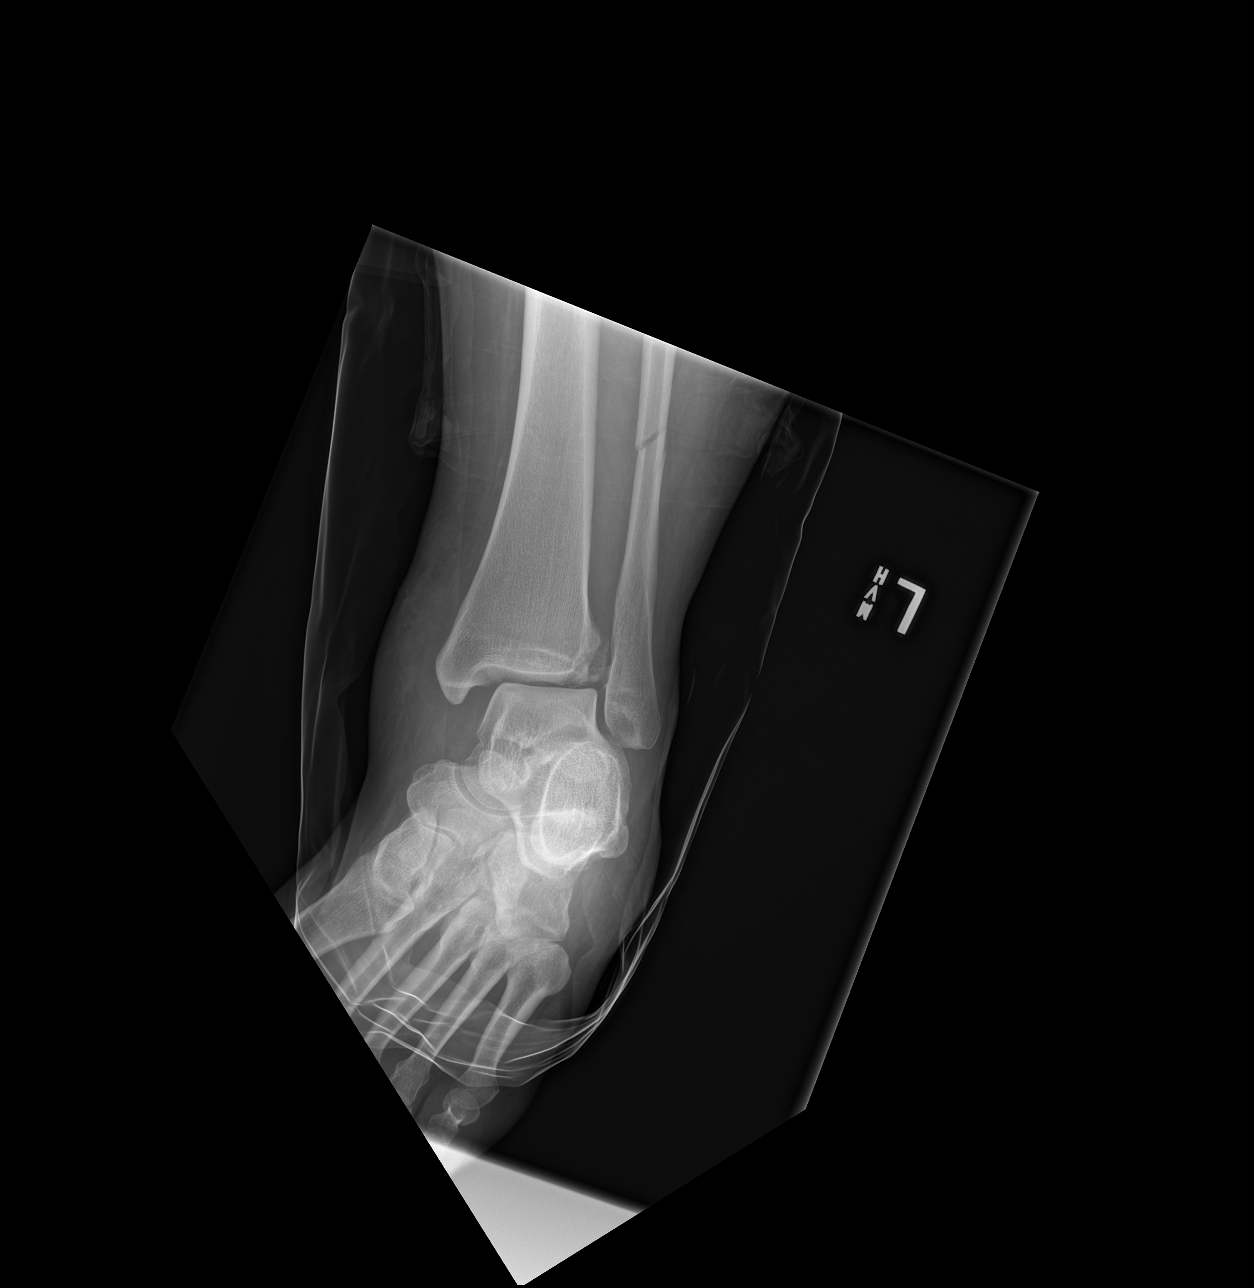

[3 of 3 positions shown; findings below may reference images not displayed]

FINDINGS: Acute fracture involving the distal shaft of the fibula at the
junction of the middle and distal thirds. Less than [DATE] bone with
lateral displacement of distal fracture fragment. Multiple osseous
fragment posterior to the ankle, possibly posterior malleolar
fracture fragments. Disruption of the ankle mortise which is widened
medially. There is mild widening of the lateral clear space.
IMPRESSION: 1. Acute mildly displaced fracture involving the distal shaft of the
fibula at the junction of the middle and distal thirds.
2. Multiple fracture fragments posterior to the ankle on lateral
view, possibly from the posterior malleolus. Disruption of the ankle
mortise with widening of the medial and lateral clear space.

## 2020-08-20 LAB — HM PAP SMEAR: HM Pap smear: NEGATIVE

## 2022-06-22 ENCOUNTER — Emergency Department (HOSPITAL_BASED_OUTPATIENT_CLINIC_OR_DEPARTMENT_OTHER): Payer: Managed Care, Other (non HMO)

## 2022-06-22 ENCOUNTER — Emergency Department (HOSPITAL_BASED_OUTPATIENT_CLINIC_OR_DEPARTMENT_OTHER)
Admission: EM | Admit: 2022-06-22 | Discharge: 2022-06-22 | Disposition: A | Discharge status: Home / Self Care | Payer: Worker's Compensation | Attending: Emergency Medicine | Admitting: Emergency Medicine

## 2022-06-22 ENCOUNTER — Encounter (HOSPITAL_BASED_OUTPATIENT_CLINIC_OR_DEPARTMENT_OTHER): Payer: Self-pay | Admitting: Emergency Medicine

## 2022-06-22 DIAGNOSIS — M25572 Pain in left ankle and joints of left foot: Secondary | ICD-10-CM | POA: Insufficient documentation

## 2022-06-22 NOTE — ED Triage Notes (Signed)
Pt arrives pov, slow limping gait with c/o LT ankle injury yesterday

## 2022-06-22 NOTE — ED Provider Notes (Signed)
[facility] Provider Note   CSN: [id number] Arrival date & time: 06/22/22  [id number]     History  Chief Complaint  Patient presents with   Ankle Pain    Linda Bradshaw is a 27 y.o. female.   Ankle Pain   27 year old female presents emergency department with complaints of left ankle pain.  Patient states that she was moving an oil drum with a dolly yesterday when she stepped backwards trying to stop her cell from falling with her ankle and dorsiflexion and noticed pain on the top of her left ankle.  Reports able to ambulate since incident but with pain and appreciable limp.  Has prior surgery from 8/20 on affected ankle.  Denies weakness or sensory deficits in affected leg.  Past medical history significant for open left fibular fracture  Home Medications Prior to Admission medications   Medication Sig Start Date End Date Taking? Authorizing Provider  cephALEXin (KEFLEX) 500 MG capsule Take 1 capsule (500 mg total) by mouth 3 (three) times daily. 11/25/18   Gary Fleet, PA-C  levonorgestrel (MIRENA) 20 MCG/24HR IUD 1 Intra Uterine Device by Intrauterine route once.    [provider]  oxyCODONE-acetaminophen (PERCOCET/ROXICET) 5-325 MG tablet Take 1-2 tablets by mouth every 6 (six) hours as needed for severe pain. 11/25/18   Gary Fleet, PA-C      Allergies    Patient has no known allergies.    Review of Systems   Review of Systems  All other systems reviewed and are negative.   Physical Exam Updated Vital Signs BP 110/75   Pulse 87   Temp 98 F (36.7 C) (Oral)   Resp 18   Wt (!) 156.9 kg   SpO2 99%   BMI 54.18 kg/m  Physical Exam Vitals and nursing note reviewed.  Constitutional:      General: She is not in acute distress.    Appearance: She is well-developed.  HENT:     Head: Normocephalic and atraumatic.  Eyes:     Conjunctiva/sclera: Conjunctivae normal.  Cardiovascular:     Rate and Rhythm: Normal  rate and regular rhythm.     Heart sounds: No murmur heard. Pulmonary:     Effort: Pulmonary effort is normal. No respiratory distress.     Breath sounds: Normal breath sounds.  Abdominal:     Palpations: Abdomen is soft.     Tenderness: There is no abdominal tenderness.  Musculoskeletal:        General: No swelling.     Cervical back: Neck supple.     Comments: Full range of motion of bilateral hips, knees, ankles, digits.  Pedal pulses 2+ bilaterally.  Patient has no tenderness to palpation of lateral or medial malleolus or base of fifth metatarsal.  Tender to palpation over superior aspect of left talus.  No sensory deficits distally.  No obvious skin abnormalities appreciated including, erythema, palpable fluctuance, induration, openings in skin.  Skin:    General: Skin is warm and dry.     Capillary Refill: Capillary refill takes less than 2 seconds.  Neurological:     Mental Status: She is alert.  Psychiatric:        Mood and Affect: Mood normal.     ED Results / Procedures / Treatments   Labs (all labs ordered are listed, but only abnormal results are displayed) Labs Reviewed - No data to display  EKG None  Radiology DG Ankle Complete Left  Addendum Date: 06/22/2022  ADDENDUM REPORT: 06/22/2022 09:30 ADDENDUM: Upon further review, there is a subtle line subjacent to exostosis off the neck of the talus which may be due to a nondisplaced fracture. Electronically Signed   By: Inge Rise M.D.   On: 06/22/2022 09:30   Result Date: 06/22/2022 CLINICAL DATA:  Left foot and ankle pain after injury yesterday. EXAM: LEFT ANKLE COMPLETE - 3+ VIEW COMPARISON:  Plain films left ankle 11/24/2018. FINDINGS: No acute bony or joint abnormality is identified. The patient is status post fixation of a healed distal fibular fracture with plate and screws in place. Postoperative change of stabilization of the ankle syndesmosis is also identified. Small plantar calcaneal spur is seen. There  is mild talonavicular degenerative disease. Soft tissues are negative. IMPRESSION: 1. No acute finding. 2. Status post fixation of a healed distal fibular fracture and stabilization of the ankle syndesmosis. 3. Mild talonavicular osteoarthritis. Electronically Signed: By: Inge Rise M.D. On: 06/22/2022 09:00   DG Foot Complete Left  Result Date: 06/22/2022 CLINICAL DATA:  Left foot and ankle pain after injury yesterday. EXAM: LEFT FOOT - COMPLETE 3+ VIEW COMPARISON:  Plain films left ankle 11/24/2018. FINDINGS: No acute bony or joint abnormality is identified. There is partial visualization of plate and screw fixation of the distal fibula and stabilization of the ankle syndesmosis. Small plantar calcaneal spur is present. There is mild talonavicular degenerative disease. IMPRESSION: No acute abnormality. Electronically Signed   By: Inge Rise M.D.   On: 06/22/2022 08:59    Procedures Procedures    Medications Ordered in ED Medications - No data to display  ED Course/ Medical Decision Making/ A&P                             Medical Decision Making Amount and/or Complexity of Data Reviewed Radiology: ordered.   This patient presents to the ED for concern of left ankle pain, this involves an extensive number of treatment options, and is a complaint that carries with it a high risk of complications and morbidity.  The differential diagnosis includes fracture, strain sprain, dislocation, neurovascular compromise, ligamentous/tendinous injury   Co morbidities that complicate the patient evaluation  See HPI   Additional history obtained:  Additional history obtained from EMR External records from outside source obtained and reviewed including hospital records   Lab Tests:  N/a   Imaging Studies ordered:  I ordered imaging studies including left ankle/foot x-ray I independently visualized and interpreted imaging which showed subtle line appreciated on superior aspect of  talus which may possibly be nondisplaced fracture.  Healed distal fibular fracture and stabilization of ankle syndesmosis.  Mild talonavicular osteoarthritis. I agree with the radiologist interpretation  Cardiac Monitoring: / EKG:  The patient was maintained on a cardiac monitor.  I personally viewed and interpreted the cardiac monitored which showed an underlying rhythm of: Sinus rhythm   Consultations Obtained:  I requested consultation with the radiologist regarding x-ray finding of possible talus fracture   Problem List / ED Course / Critical interventions / Medication management  Left ankle pain Reevaluation of the patient showed that the patient stayed the same I have reviewed the patients home medicines and have made adjustments as needed   Social Determinants of Health:  Denies tobacco, illicit drug use   Test / Admission - Considered:  Left ankle pain Vitals signs within normal range and stable throughout visit. Laboratory/imaging studies significant for: See above Patient's left ankle pain  most consistent with possible nondisplaced fracture of the upper part of the talus.  Patient placed in cam walker boot as well as given crutches to help aid in ambulation.  Given information to follow-up with care for orthopedics outpatient.  Patient recommended rest, ice, elevation and NSAIDs in the form of ibuprofen for symptoms.  Recommend close follow-up with orthopedics as soon as she is able.  Treatment plan discussed at length with patient she acknowledged understanding was agreeable to said plan. Worrisome signs and symptoms were discussed with the patient, and the patient acknowledged understanding to return to the ED if noticed. Patient was stable upon discharge.          Final Clinical Impression(s) / ED Diagnoses Final diagnoses:  Acute left ankle pain    Rx / DC Orders ED Discharge Orders     None         Wilnette Kales, [place] 06/22/22 [id number]     Regan Lemming, MD 06/22/22 1146

## 2022-06-22 NOTE — Discharge Instructions (Addendum)
Note the workup today was consistent with fracture of left talus.  As discussed, wear boot until follow-up with orthopedics.  You may use crutches as needed to help aid in ambulation.  Call number to earliest convenience to set up an appointment.  Use Tylenol/Motrin for pain in the meantime.  You may rest, ice, elevate affected ankle.  Please not hesitate to return to emergency department if the worrisome signs and symptoms we discussed become apparent.

## 2023-04-03 ENCOUNTER — Ambulatory Visit (HOSPITAL_BASED_OUTPATIENT_CLINIC_OR_DEPARTMENT_OTHER)
Admission: EM | Admit: 2023-04-03 | Discharge: 2023-04-03 | Disposition: A | Discharge status: Home / Self Care | Payer: BC Managed Care – PPO | Attending: Physician Assistant | Admitting: Physician Assistant

## 2023-04-03 ENCOUNTER — Encounter (HOSPITAL_BASED_OUTPATIENT_CLINIC_OR_DEPARTMENT_OTHER): Payer: Self-pay | Admitting: Emergency Medicine

## 2023-04-03 DIAGNOSIS — J209 Acute bronchitis, unspecified: Secondary | ICD-10-CM | POA: Diagnosis not present

## 2023-04-03 MED ORDER — ONDANSETRON 4 MG PO TBDP: 4.0000 mg | ORAL_TABLET | Freq: Once | ORAL | Status: DC

## 2023-04-03 MED ORDER — PREDNISONE 10 MG PO TABS: 40.0000 mg | ORAL_TABLET | Freq: Every day | ORAL | 0 refills | Status: AC

## 2023-04-03 MED ORDER — BENZONATATE 100 MG PO CAPS: 100.0000 mg | ORAL_CAPSULE | Freq: Three times a day (TID) | ORAL | 0 refills | Status: DC

## 2023-04-03 MED ORDER — PROMETHAZINE-DM 6.25-15 MG/5ML PO SYRP: 5.0000 mL | ORAL_SOLUTION | Freq: Four times a day (QID) | ORAL | 0 refills | Status: DC | PRN

## 2023-04-03 NOTE — Discharge Instructions (Addendum)
Take prednisone as prescribed Can take Tessalon as needed for cough Drink plenty of water, rest Return if you have worsening symptoms, fever, night sweat, shortness of breath, wheezing

## 2023-04-03 NOTE — ED Provider Notes (Signed)
[facility]    CSN: [id number] Arrival date & time: 04/03/23  [id number]      History   Chief Complaint Chief Complaint  Patient presents with   Cough    HPI Linda Bradshaw is a 27 y.o. female.   Patient complains of cough that started about a week and a half ago.  She denies fever, chills, night sweats, body aches.  She reports initially experiencing URI with congestion, congestion has resolved but cough is persistent.  Patient denies cough keeping her up at night.  She denies wheezing or shortness of breath.  She does report a coworker is currently sick with pneumonia.  She has been taking OTC cough medicines including Mucinex and cough syrup with minimal relief.    History reviewed. No pertinent past medical history.  Patient Active Problem List   Diagnosis Date Noted   Open left fibular fracture 11/25/2018    Past Surgical History:  Procedure Laterality Date   CESAREAN SECTION     ORIF ANKLE FRACTURE Left 11/25/2018   Procedure: OPEN REDUCTION INTERNAL FIXATION (ORIF) ANKLE FRACTURE;  Surgeon: Jodi Geralds, MD;  Location: [facility];  Service: Orthopedics;  Laterality: Left;    OB History   No obstetric history on file.      Home Medications    Prior to Admission medications   Medication Sig Start Date End Date Taking? Authorizing Provider  benzonatate (TESSALON) 100 MG capsule Take 1 capsule (100 mg total) by mouth every 8 (eight) hours. 04/03/23  Yes Ward, Tylene Fantasia, PA-C  predniSONE (DELTASONE) 10 MG tablet Take 4 tablets (40 mg total) by mouth daily for 5 days. 04/03/23 04/08/23 Yes Ward, Tylene Fantasia, PA-C  promethazine-dextromethorphan (PROMETHAZINE-DM) 6.25-15 MG/5ML syrup Take 5 mLs by mouth 4 (four) times daily as needed for cough. 04/03/23  Yes Ward, Tylene Fantasia, PA-C  cephALEXin (KEFLEX) 500 MG capsule Take 1 capsule (500 mg total) by mouth 3 (three) times daily. 11/25/18   Marshia Ly, PA-C  levonorgestrel (MIRENA) 20 MCG/24HR IUD 1 Intra Uterine  Device by Intrauterine route once.    [provider]  oxyCODONE-acetaminophen (PERCOCET/ROXICET) 5-325 MG tablet Take 1-2 tablets by mouth every 6 (six) hours as needed for severe pain. 11/25/18   Marshia Ly, PA-C    Family History History reviewed. No pertinent family history.  Social History Social History   Tobacco Use   Smoking status: Never   Smokeless tobacco: Never  Substance Use Topics   Alcohol use: Never   Drug use: Never     Allergies   Patient has no known allergies.   Review of Systems Review of Systems  Constitutional:  Negative for chills and fever.  HENT:  Negative for ear pain and sore throat.   Eyes:  Negative for pain and visual disturbance.  Respiratory:  Positive for cough. Negative for shortness of breath.   Cardiovascular:  Negative for chest pain and palpitations.  Gastrointestinal:  Negative for abdominal pain and vomiting.  Genitourinary:  Negative for dysuria and hematuria.  Musculoskeletal:  Negative for arthralgias and back pain.  Skin:  Negative for color change and rash.  Neurological:  Negative for seizures and syncope.  All other systems reviewed and are negative.    Physical Exam Triage Vital Signs ED Triage Vitals  Encounter Vitals Group     BP 04/03/23 0926 138/85     Systolic BP Percentile --      Diastolic BP Percentile --      Pulse Rate 04/03/23  0926 73     Resp 04/03/23 0926 18     Temp 04/03/23 0926 97.7 F (36.5 C)     Temp Source 04/03/23 0926 Oral     SpO2 04/03/23 0926 97 %     Weight --      Height --      Head Circumference --      Peak Flow --      Pain Score 04/03/23 0924 0     Pain Loc --      Pain Education --      Exclude from Growth Chart --    No data found.  Updated Vital Signs BP 138/85 (BP Location: Right Arm)   Pulse 73   Temp 97.7 F (36.5 C) (Oral)   Resp 18   SpO2 97%   Visual Acuity Right Eye Distance:   Left Eye Distance:   Bilateral Distance:    Right Eye Near:    Left Eye Near:    Bilateral Near:     Physical Exam Vitals and nursing note reviewed.  Constitutional:      General: She is not in acute distress.    Appearance: She is well-developed.  HENT:     Head: Normocephalic and atraumatic.  Eyes:     Conjunctiva/sclera: Conjunctivae normal.  Cardiovascular:     Rate and Rhythm: Normal rate and regular rhythm.     Heart sounds: No murmur heard. Pulmonary:     Effort: Pulmonary effort is normal. No respiratory distress.     Breath sounds: Normal breath sounds.  Abdominal:     Palpations: Abdomen is soft.     Tenderness: There is no abdominal tenderness.  Musculoskeletal:        General: No swelling.     Cervical back: Neck supple.  Skin:    General: Skin is warm and dry.     Capillary Refill: Capillary refill takes less than 2 seconds.  Neurological:     Mental Status: She is alert.  Psychiatric:        Mood and Affect: Mood normal.      UC Treatments / Results  Labs (all labs ordered are listed, but only abnormal results are displayed) Labs Reviewed - No data to display  EKG   Radiology No results found.  Procedures Procedures (including critical care time)  Medications Ordered in UC Medications - No data to display  Initial Impression / Assessment and Plan / UC Course  I have reviewed the triage vital signs and the nursing notes.  Pertinent labs & imaging results that were available during my care of the patient were reviewed by me and considered in my medical decision making (see chart for details).     Patient overall well-appearing in no acute distress, lungs clear to auscultation.  At this time not concerning for pneumonia.  Will treat for bronchitis, prednisone prescribed along with Tessalon.  Return precautions discussed. Final Clinical Impressions(s) / UC Diagnoses   Final diagnoses:  Acute bronchitis, unspecified organism     Discharge Instructions      Take prednisone as prescribed Can take  Tessalon as needed for cough Drink plenty of water, rest Return if you have worsening symptoms, fever, night sweat, shortness of breath, wheezing   ED Prescriptions     Medication Sig Dispense Auth. Provider   predniSONE (DELTASONE) 10 MG tablet Take 4 tablets (40 mg total) by mouth daily for 5 days. 20 tablet Ward, Tylene Fantasia, PA-C   benzonatate (TESSALON) 100  MG capsule Take 1 capsule (100 mg total) by mouth every 8 (eight) hours. 21 capsule Ward, Tylene Fantasia, PA-C   promethazine-dextromethorphan (PROMETHAZINE-DM) 6.25-15 MG/5ML syrup Take 5 mLs by mouth 4 (four) times daily as needed for cough. 118 mL Ward, Tylene Fantasia, PA-C      PDMP not reviewed this encounter.   Ward, Tylene Fantasia, PA-C 04/03/23 1028

## 2023-04-03 NOTE — ED Triage Notes (Signed)
Pt c/o persistent cough x 1 week, Pt has taken OTC mucinex and cold and cough medication.

## 2023-05-03 ENCOUNTER — Ambulatory Visit (INDEPENDENT_AMBULATORY_CARE_PROVIDER_SITE_OTHER): Payer: BC Managed Care – PPO | Admitting: Student

## 2023-05-03 ENCOUNTER — Encounter (HOSPITAL_BASED_OUTPATIENT_CLINIC_OR_DEPARTMENT_OTHER): Payer: Self-pay | Admitting: Student

## 2023-05-03 ENCOUNTER — Encounter (HOSPITAL_BASED_OUTPATIENT_CLINIC_OR_DEPARTMENT_OTHER): Payer: Self-pay

## 2023-05-03 VITALS — BP 129/83 | HR 82 | Temp 97.4°F | Ht 65.95 in | Wt 375.2 lb

## 2023-05-03 DIAGNOSIS — Z7689 Persons encountering health services in other specified circumstances: Secondary | ICD-10-CM | POA: Diagnosis not present

## 2023-05-03 DIAGNOSIS — Z8639 Personal history of other endocrine, nutritional and metabolic disease: Secondary | ICD-10-CM | POA: Insufficient documentation

## 2023-05-03 NOTE — Progress Notes (Signed)
 New Patient Office Visit  Subjective    Patient ID: Linda Bradshaw, female    DOB: Sep 04, 1995  Age: 28 y.o. MRN: [id number]  CC:  Chief Complaint  Patient presents with   Establish Care    Here to establish care. Wanted to start taking care of herself. Clemens last week on a toy and decided she need to take more care.     HPI Linda Bradshaw presents to establish care. Has not had prior PCP. Last physical was high school. she notes that she requires refills of no medications at this time. Has moved from [facility], resident in [place] for about 1 year.  Obesity- BMI 60.66. Current diet: patient is trying a calorie deficit. Protein bar in the morning and snacks during the day. Snacks include fruit, and protein snacks such as [employer] bars. About 2 months of diet and exercise currently. Previously ate a lot at night. . Currently using calorie counting app. Exercise: mainly running around on the job and lifting job materials as well as playing with son (almost 2yo). Lost around 5lb. Encouraged patient to continue in efforts.  History of low iron- believes that she was low in iron in the past. No noted symptoms of IDA aside from slight fatigue which she believes may be related to low iron. Patient deferred any further workup of fatigue for now- patient denies snoring.  Screenings:  Colon Cancer: Not Indicated Lung Cancer: Not indicated Breast Cancer: Not yet indicated. Cervical Cancer: [facility]es [facility]- Sees Dr. Evangeline in [place]. Diabetes: indicated  HLD: indicated    Outpatient Encounter Medications as of 05/03/2023  Medication Sig   levonorgestrel (MIRENA) 20 MCG/24HR IUD 1 Intra Uterine Device by Intrauterine route once.   [DISCONTINUED] benzonatate  (TESSALON ) 100 MG capsule Take 1 capsule (100 mg total) by mouth every 8 (eight) hours. (Patient not taking: Reported on 05/03/2023)   [DISCONTINUED] cephALEXin  (KEFLEX ) 500 MG capsule Take 1 capsule (500 mg total) by mouth 3 (three) times  daily. (Patient not taking: Reported on 05/03/2023)   [DISCONTINUED] oxyCODONE -acetaminophen  (PERCOCET/ROXICET) 5-325 MG tablet Take 1-2 tablets by mouth every 6 (six) hours as needed for severe pain.   [DISCONTINUED] promethazine -dextromethorphan (PROMETHAZINE -DM) 6.25-15 MG/5ML syrup Take 5 mLs by mouth 4 (four) times daily as needed for cough. (Patient not taking: Reported on 05/03/2023)   No facility-administered encounter medications on file as of 05/03/2023.    Past Medical History:  Diagnosis Date   Fracture of ankle 06/27/2019   Open Fx left ankle '20      Past Surgical History:  Procedure Laterality Date   CESAREAN SECTION     ORIF ANKLE FRACTURE Left 11/25/2018   Procedure: OPEN REDUCTION INTERNAL FIXATION (ORIF) ANKLE FRACTURE;  Surgeon: Yvone Rush, MD;  Location: [facility];  Service: Orthopedics;  Laterality: Left;    Family History  Problem Relation Age of Onset   Heart attack Paternal Grandfather        caused by HTN   Hypertension Paternal Grandfather     Social History   Socioeconomic History   Marital status: Married    Spouse name: Not on file   Number of children: 1   Years of education: Not on file   Highest education level: Not on file  Occupational History   Not on file  Tobacco Use   Smoking status: Never    Passive exposure: Never   Smokeless tobacco: Never  Vaping Use   Vaping status: Never Used  Substance and Sexual Activity  Alcohol use: Never   Drug use: Never   Sexual activity: Not on file  Other Topics Concern   Not on file  Social History Narrative   Not on file   Social Drivers of Health   Financial Resource Strain: Not on file  Food Insecurity: No Food Insecurity (05/03/2023)   Hunger Vital Sign    Worried About Running Out of Food in the Last Year: Never true    Ran Out of Food in the Last Year: Never true  Transportation Needs: No Transportation Needs (05/03/2023)   PRAPARE - Administrator, Civil Service (Medical):  No    Lack of Transportation (Non-Medical): No  Physical Activity: Not on file  Stress: Not on file  Social Connections: Not on file  Intimate Partner Violence: Not At Risk (05/03/2023)   Humiliation, Afraid, Rape, and Kick questionnaire    Fear of Current or Ex-Partner: No    Emotionally Abused: No    Physically Abused: No    Sexually Abused: No    ROS  Per HPI      Objective    BP 129/83 (BP Location: Right Arm, Patient Position: Sitting, Cuff Size: Large)   Pulse 82   Temp (!) 97.4 F (36.3 C) (Oral)   Ht 5' 5.95 (1.675 m)   Wt (!) 375 lb 3.2 oz (170.2 kg)   LMP 05/01/2023 (Exact Date)   SpO2 97%   BMI 60.66 kg/m   Physical Exam Constitutional:      General: She is not in acute distress.    Appearance: Normal appearance. She is not ill-appearing.  HENT:     Head: Normocephalic and atraumatic.     Right Ear: External ear normal.     Left Ear: External ear normal.     Nose: Nose normal.     Mouth/Throat:     Mouth: Mucous membranes are moist.     Pharynx: Oropharynx is clear.     Comments: Mallampati Score: 1 (no increased risk for OSA) Eyes:     General: No scleral icterus.    Extraocular Movements: Extraocular movements intact.     Conjunctiva/sclera: Conjunctivae normal.     Pupils: Pupils are equal, round, and reactive to light.  Neck:     Comments: No thyromegaly Cardiovascular:     Rate and Rhythm: Normal rate and regular rhythm.     Pulses: Normal pulses.     Heart sounds: Normal heart sounds. No murmur heard.    No friction rub.  Pulmonary:     Effort: Pulmonary effort is normal. No respiratory distress.     Breath sounds: Normal breath sounds. No wheezing, rhonchi or rales.  Musculoskeletal:        General: Normal range of motion.     Cervical back: Neck supple.     Right lower leg: No edema.     Left lower leg: No edema.  Lymphadenopathy:     Cervical: No cervical adenopathy.  Skin:    General: Skin is warm and dry.  Neurological:      General: No focal deficit present.     Mental Status: She is alert.         Assessment & Plan:   Encounter to establish care  History of iron deficiency Assessment & Plan: Assess for any continued iron deficiency. No PE findings consistent with. Patient notes slight fatigue.  Orders: -     Iron, TIBC and Ferritin Panel  Morbid obesity (HCC) Assessment & Plan: BMI  60.6.  Continue to work on diet with low calorie snacks. Incorporate high protein foods to aid in hunger cessation. Continue calorie counting app. Increase exercise to 150 minutes moderate intensity exercise weekly. Order basic labwork to assess for any underlying disease.  Orders: -     CBC with Differential/Platelet -     Comprehensive metabolic panel -     Hemoglobin A1c -     Lipid panel   I have spent greater than 45 minutes charting, educating, diagnosing and managing this patient for this visit.   Return in about 3 months (around 08/01/2023) for Annual Physical.   Shevonne Wolf T Hason Ofarrell, PA-C

## 2023-05-03 NOTE — Assessment & Plan Note (Signed)
 Assess for any continued iron deficiency. No PE findings consistent with. Patient notes slight fatigue.

## 2023-05-03 NOTE — Assessment & Plan Note (Addendum)
 BMI 60.6.  Continue to work on diet with low calorie snacks. Incorporate high protein foods to aid in hunger cessation. Continue calorie counting app. Increase exercise to 150 minutes moderate intensity exercise weekly. Order basic labwork to assess for any underlying disease.

## 2023-05-03 NOTE — Patient Instructions (Signed)
 It was nice to see you today!  As we discussed in clinic continue to utilize your fitness tracker and be sure to incorporate around 150 minutes of moderate intesnity exercise weekly (this is things such as a brisk walk).  I will contact you about your lab results and we can discuss them in more detail at your physical exam.  Be sure to contact your OBGYN to follow with them.  If you have any problems before your next visit feel free to message me via MyChart (minor issues or questions) or call the office, otherwise you may reach out to schedule an office visit.  Thank you! Dejia Ebron, PA-C

## 2023-05-06 ENCOUNTER — Encounter (HOSPITAL_BASED_OUTPATIENT_CLINIC_OR_DEPARTMENT_OTHER): Payer: Self-pay | Admitting: Student

## 2023-05-06 LAB — LIPID PANEL
Chol/HDL Ratio: 4.3 {ratio} (ref 0.0–4.4)
Cholesterol, Total: 171 mg/dL (ref 100–199)
HDL: 40 mg/dL (ref >39)
LDL Chol Calc (NIH): 112 mg/dL — ABNORMAL HIGH (ref 0–99)
Triglycerides: 105 mg/dL (ref 0–149)
VLDL Cholesterol Cal: 19 mg/dL (ref 5–40)

## 2023-05-06 LAB — CBC WITH DIFFERENTIAL/PLATELET
Basophils Absolute: 0 10*3/uL (ref 0.0–0.2)
Basos: 0 %
EOS (ABSOLUTE): 0.1 10*3/uL (ref 0.0–0.4)
Eos: 1 %
Hematocrit: 42.1 % (ref 34.0–46.6)
Hemoglobin: 13.5 g/dL (ref 11.1–15.9)
Immature Grans (Abs): 0 10*3/uL (ref 0.0–0.1)
Immature Granulocytes: 0 %
Lymphocytes Absolute: 3 10*3/uL (ref 0.7–3.1)
Lymphs: 38 %
MCH: 25 pg — ABNORMAL LOW (ref 26.6–33.0)
MCHC: 32.1 g/dL (ref 31.5–35.7)
MCV: 78 fL — ABNORMAL LOW (ref 79–97)
Monocytes Absolute: 0.4 10*3/uL (ref 0.1–0.9)
Monocytes: 5 %
Neutrophils Absolute: 4.5 10*3/uL (ref 1.4–7.0)
Neutrophils: 56 %
Platelets: 411 10*3/uL (ref 150–450)
RBC: 5.39 x10E6/uL — ABNORMAL HIGH (ref 3.77–5.28)
RDW: 13.6 % (ref 11.7–15.4)
WBC: 8 10*3/uL (ref 3.4–10.8)

## 2023-05-06 LAB — IRON,TIBC AND FERRITIN PANEL
Ferritin: 25 ng/mL (ref 15–150)
Iron Saturation: 7 % — CL (ref 15–55)
Iron: 30 ug/dL (ref 27–159)
Total Iron Binding Capacity: 423 ug/dL (ref 250–450)
UIBC: 393 ug/dL (ref 131–425)

## 2023-05-06 LAB — COMPREHENSIVE METABOLIC PANEL
ALT: 15 [IU]/L (ref 0–32)
AST: 19 [IU]/L (ref 0–40)
Albumin: 4.4 g/dL (ref 4.0–5.0)
Alkaline Phosphatase: 70 [IU]/L (ref 44–121)
BUN/Creatinine Ratio: 16 (ref 9–23)
BUN: 12 mg/dL (ref 6–20)
Bilirubin Total: 0.2 mg/dL (ref 0.0–1.2)
CO2: 23 mmol/L (ref 20–29)
Calcium: 9.5 mg/dL (ref 8.7–10.2)
Chloride: 101 mmol/L (ref 96–106)
Creatinine, Ser: 0.74 mg/dL (ref 0.57–1.00)
Globulin, Total: 2.7 g/dL (ref 1.5–4.5)
Glucose: 86 mg/dL (ref 70–99)
Potassium: 4.2 mmol/L (ref 3.5–5.2)
Sodium: 140 mmol/L (ref 134–144)
Total Protein: 7.1 g/dL (ref 6.0–8.5)
eGFR: 114 mL/min/{1.73_m2} (ref >59)

## 2023-05-06 LAB — HEMOGLOBIN A1C
Est. average glucose Bld gHb Est-mCnc: 111 mg/dL
Hgb A1c MFr Bld: 5.5 % (ref 4.8–5.6)

## 2023-07-20 ENCOUNTER — Ambulatory Visit (HOSPITAL_BASED_OUTPATIENT_CLINIC_OR_DEPARTMENT_OTHER)
Admission: EM | Admit: 2023-07-20 | Discharge: 2023-07-20 | Disposition: A | Discharge status: Home / Self Care | Attending: Nurse Practitioner | Admitting: Nurse Practitioner

## 2023-07-20 ENCOUNTER — Other Ambulatory Visit: Payer: Self-pay

## 2023-07-20 ENCOUNTER — Encounter (HOSPITAL_BASED_OUTPATIENT_CLINIC_OR_DEPARTMENT_OTHER): Payer: Self-pay | Admitting: Emergency Medicine

## 2023-07-20 DIAGNOSIS — R051 Acute cough: Secondary | ICD-10-CM | POA: Diagnosis not present

## 2023-07-20 DIAGNOSIS — J302 Other seasonal allergic rhinitis: Secondary | ICD-10-CM

## 2023-07-20 MED ORDER — PROMETHAZINE-DM 6.25-15 MG/5ML PO SYRP: 5.0000 mL | ORAL_SOLUTION | Freq: Four times a day (QID) | ORAL | 0 refills | Status: DC | PRN

## 2023-07-20 MED ORDER — ALBUTEROL SULFATE HFA 108 (90 BASE) MCG/ACT IN AERS: 2.0000 | INHALATION_SPRAY | RESPIRATORY_TRACT | 0 refills | Status: AC | PRN

## 2023-07-20 MED ORDER — PREDNISONE 10 MG PO TABS: ORAL_TABLET | ORAL | 0 refills | Status: AC

## 2023-07-20 NOTE — ED Provider Notes (Signed)
 [name]    CSN: [id number] Arrival date & time: 07/20/23  0944      History   Chief Complaint Chief Complaint  Patient presents with   Cough    HPI Linda Bradshaw is a 28 y.o. female.   Patient presents requesting evaluation for a cough that has been ongoing since Friday.  States she also has an associated runny nose, sore throat, wheezing, some chest discomfort and shortness of breath, and feeling like it is hard to breathe at night.  She has taken Mucinex and Benadryl to help alleviate the symptoms.  Also tried a Hydrologist which did not help.  Denies any associated fever, abdominal pain, vomiting, or diarrhea.  Has a history of bronchitis in the past and states this feels very similar.  The history is provided by the patient.  Cough Associated symptoms: rhinorrhea, shortness of breath and sore throat   Associated symptoms: no chest pain, no chills, no ear pain, no fever, no headaches, no myalgias and no rash     Past Medical History:  Diagnosis Date   Fracture of ankle 06/27/2019   Open Fx left ankle '20      Patient Active Problem List   Diagnosis Date Noted   Morbid obesity (HCC) 05/03/2023   History of iron deficiency 05/03/2023   Open left fibular fracture 11/25/2018    Past Surgical History:  Procedure Laterality Date   CESAREAN SECTION     ORIF ANKLE FRACTURE Left 11/25/2018   Procedure: OPEN REDUCTION INTERNAL FIXATION (ORIF) ANKLE FRACTURE;  Surgeon: Jodi Geralds, MD;  Location: [facility];  Service: Orthopedics;  Laterality: Left;    OB History   No obstetric history on file.      Home Medications    Prior to Admission medications   Medication Sig Start Date End Date Taking? Authorizing Provider  albuterol (VENTOLIN HFA) 108 (90 Base) MCG/ACT inhaler Inhale 2 puffs into the lungs every 4 (four) hours as needed for wheezing or shortness of breath. 07/20/23  Yes Burgess Estelle, FNP  predniSONE (DELTASONE) 10 MG tablet Take 6 tablets  (60 mg total) by mouth daily with breakfast for 1 day, THEN 5 tablets (50 mg total) daily with breakfast for 1 day, THEN 4 tablets (40 mg total) daily with breakfast for 1 day, THEN 3 tablets (30 mg total) daily with breakfast for 1 day, THEN 2 tablets (20 mg total) daily with breakfast for 1 day, THEN 1 tablet (10 mg total) daily with breakfast for 1 day. 07/20/23 07/26/23 Yes Burgess Estelle, FNP  promethazine-dextromethorphan (PROMETHAZINE-DM) 6.25-15 MG/5ML syrup Take 5 mLs by mouth 4 (four) times daily as needed for cough. 07/20/23  Yes Burgess Estelle, FNP  levonorgestrel (MIRENA) 20 MCG/24HR IUD 1 Intra Uterine Device by Intrauterine route once.    [provider]    Family History Family History  Problem Relation Age of Onset   Heart attack Paternal Grandfather        caused by HTN   Hypertension Paternal Grandfather     Social History Social History   Tobacco Use   Smoking status: Never    Passive exposure: Never   Smokeless tobacco: Never  Vaping Use   Vaping status: Never Used  Substance Use Topics   Alcohol use: Never   Drug use: Never     Allergies   Patient has no known allergies.   Review of Systems Review of Systems  Constitutional:  Negative for chills and fever.  HENT:  Positive for rhinorrhea and sore throat. Negative for ear pain.   Eyes:  Negative for pain.  Respiratory:  Positive for cough, chest tightness and shortness of breath.   Cardiovascular:  Negative for chest pain.  Gastrointestinal:  Negative for abdominal pain, diarrhea, nausea and vomiting.  Musculoskeletal:  Negative for arthralgias and myalgias.  Skin:  Negative for rash.  Neurological:  Negative for dizziness, weakness and headaches.     Physical Exam Triage Vital Signs ED Triage Vitals [07/20/23 0957]  Encounter Vitals Group     BP 132/87     Systolic BP Percentile      Diastolic BP Percentile      Pulse Rate 87     Resp 16     Temp 98.9 F (37.2 C)     Temp Source Oral      SpO2 98 %     Weight      Height      Head Circumference      Peak Flow      Pain Score 0     Pain Loc      Pain Education      Exclude from Growth Chart    No data found.  Updated Vital Signs BP 132/87 (BP Location: Right Arm)   Pulse 87   Temp 98.9 F (37.2 C) (Oral)   Resp 16   SpO2 98%   Visual Acuity Right Eye Distance:   Left Eye Distance:   Bilateral Distance:    Right Eye Near:   Left Eye Near:    Bilateral Near:     Physical Exam Vitals and nursing note reviewed.  Constitutional:      Appearance: Normal appearance.  HENT:     Head: Normocephalic.     Right Ear: Tympanic membrane and ear canal normal.     Left Ear: Tympanic membrane and ear canal normal.     Nose: Congestion present.     Mouth/Throat:     Mouth: Mucous membranes are moist.     Pharynx: No posterior oropharyngeal erythema.  Eyes:     Extraocular Movements: Extraocular movements intact.     Conjunctiva/sclera: Conjunctivae normal.     Pupils: Pupils are equal, round, and reactive to light.  Cardiovascular:     Rate and Rhythm: Normal rate and regular rhythm.  Pulmonary:     Effort: Pulmonary effort is normal.     Breath sounds: Normal breath sounds. No wheezing, rhonchi or rales.  Abdominal:     General: Bowel sounds are normal.  Neurological:     General: No focal deficit present.     Mental Status: She is alert.  Psychiatric:        Mood and Affect: Mood normal.        Behavior: Behavior normal.        Thought Content: Thought content normal.        Judgment: Judgment normal.      UC Treatments / Results  Labs (all labs ordered are listed, but only abnormal results are displayed) Labs Reviewed - No data to display  EKG   Radiology No results found.  Procedures Procedures (including critical care time)  Medications Ordered in UC Medications - No data to display  Initial Impression / Assessment and Plan / UC Course  I have reviewed the triage vital signs and the  nursing notes.  Pertinent labs & imaging results that were available during my care of the patient were reviewed by me  and considered in my medical decision making (see chart for details).    Patient presenting with a cough that has been ongoing since Friday.  There is an associated runny nose, sore throat, wheezing, sensation of shortness of breath and chest discomfort.  I sent a prescription to the pharmacy for a short steroid taper, an albuterol inhaler, and a cough syrup to alleviate the symptoms.  No indication for chest x-ray based upon today's visit.  However, I reviewed that if she develops a fever, worsening symptoms, or increasing chest pain/shortness of breath, return for reevaluation.  May use OTC Zyrtec and Flonase as well to help with symptom management.  Physical exam/vital signs are reassuring.  Final Clinical Impressions(s) / UC Diagnoses   Final diagnoses:  Acute cough  Seasonal allergies     Discharge Instructions      Prescriptions for a steroid taper, cough syrup, and albuterol inhaler have been sent to the pharmacy to help alleviate your symptoms.  Recommend taking over-the-counter Zyrtec and using Flonase as well.  Ensure adequate rest and oral hydration.     ED Prescriptions     Medication Sig Dispense Auth. Provider   predniSONE (DELTASONE) 10 MG tablet Take 6 tablets (60 mg total) by mouth daily with breakfast for 1 day, THEN 5 tablets (50 mg total) daily with breakfast for 1 day, THEN 4 tablets (40 mg total) daily with breakfast for 1 day, THEN 3 tablets (30 mg total) daily with breakfast for 1 day, THEN 2 tablets (20 mg total) daily with breakfast for 1 day, THEN 1 tablet (10 mg total) daily with breakfast for 1 day. 21 tablet Burgess Estelle, FNP   albuterol (VENTOLIN HFA) 108 (90 Base) MCG/ACT inhaler Inhale 2 puffs into the lungs every 4 (four) hours as needed for wheezing or shortness of breath. 18 g Burgess Estelle, FNP   promethazine-dextromethorphan  (PROMETHAZINE-DM) 6.25-15 MG/5ML syrup Take 5 mLs by mouth 4 (four) times daily as needed for cough. 118 mL Burgess Estelle, FNP      PDMP not reviewed this encounter.   Burgess Estelle, FNP 07/20/23 [phone]

## 2023-07-20 NOTE — ED Triage Notes (Signed)
 Pt reports persistent cough since last Friday.

## 2023-07-20 NOTE — Discharge Instructions (Signed)
 Prescriptions for a steroid taper, cough syrup, and albuterol inhaler have been sent to the pharmacy to help alleviate your symptoms.  Recommend taking over-the-counter Zyrtec and using Flonase as well.  Ensure adequate rest and oral hydration.

## 2023-07-28 ENCOUNTER — Emergency Department (HOSPITAL_BASED_OUTPATIENT_CLINIC_OR_DEPARTMENT_OTHER)

## 2023-07-28 ENCOUNTER — Other Ambulatory Visit: Payer: Self-pay

## 2023-07-28 ENCOUNTER — Emergency Department (HOSPITAL_BASED_OUTPATIENT_CLINIC_OR_DEPARTMENT_OTHER)
Admission: EM | Admit: 2023-07-28 | Discharge: 2023-07-28 | Disposition: A | Discharge status: Home / Self Care | Attending: Emergency Medicine | Admitting: Emergency Medicine

## 2023-07-28 ENCOUNTER — Encounter (HOSPITAL_BASED_OUTPATIENT_CLINIC_OR_DEPARTMENT_OTHER): Payer: Self-pay | Admitting: Emergency Medicine

## 2023-07-28 DIAGNOSIS — M545 Low back pain, unspecified: Secondary | ICD-10-CM | POA: Diagnosis not present

## 2023-07-28 DIAGNOSIS — T148XXA Other injury of unspecified body region, initial encounter: Secondary | ICD-10-CM

## 2023-07-28 DIAGNOSIS — X500XXA Overexertion from strenuous movement or load, initial encounter: Secondary | ICD-10-CM | POA: Insufficient documentation

## 2023-07-28 DIAGNOSIS — M47816 Spondylosis without myelopathy or radiculopathy, lumbar region: Secondary | ICD-10-CM | POA: Diagnosis not present

## 2023-07-28 DIAGNOSIS — Y990 Civilian activity done for income or pay: Secondary | ICD-10-CM | POA: Insufficient documentation

## 2023-07-28 DIAGNOSIS — M4807 Spinal stenosis, lumbosacral region: Secondary | ICD-10-CM | POA: Diagnosis not present

## 2023-07-28 DIAGNOSIS — M549 Dorsalgia, unspecified: Secondary | ICD-10-CM | POA: Diagnosis present

## 2023-07-28 DIAGNOSIS — S3992XA Unspecified injury of lower back, initial encounter: Secondary | ICD-10-CM | POA: Diagnosis not present

## 2023-07-28 DIAGNOSIS — S39012A Strain of muscle, fascia and tendon of lower back, initial encounter: Secondary | ICD-10-CM | POA: Diagnosis not present

## 2023-07-28 LAB — URINALYSIS, ROUTINE W REFLEX MICROSCOPIC
Bacteria, UA: NONE SEEN
Bilirubin Urine: NEGATIVE
Glucose, UA: NEGATIVE mg/dL
Hgb urine dipstick: NEGATIVE
Ketones, ur: NEGATIVE mg/dL
Nitrite: NEGATIVE
Specific Gravity, Urine: 1.019 (ref 1.005–1.030)
pH: 8 (ref 5.0–8.0)

## 2023-07-28 LAB — PREGNANCY, URINE: Preg Test, Ur: NEGATIVE

## 2023-07-28 MED ORDER — KETOROLAC TROMETHAMINE 15 MG/ML IJ SOLN
15.0000 mg | Freq: Once | INTRAMUSCULAR | Status: AC
  Administered 2023-07-28: 15 mg via INTRAMUSCULAR
  Filled 2023-07-28: qty 1

## 2023-07-28 MED ORDER — OXYCODONE-ACETAMINOPHEN 5-325 MG PO TABS: 1.0000 | ORAL_TABLET | Freq: Four times a day (QID) | ORAL | 0 refills | Status: DC | PRN

## 2023-07-28 MED ORDER — IBUPROFEN 600 MG PO TABS: 600.0000 mg | ORAL_TABLET | Freq: Four times a day (QID) | ORAL | 0 refills | Status: AC | PRN

## 2023-07-28 NOTE — ED Triage Notes (Signed)
 Low back pain, middle Denies injury. Woke up with back pain yesterday No relief with OTC back meds.  Rest helps, worse with movement

## 2023-07-28 NOTE — Discharge Instructions (Addendum)
 As discussed, your x-ray does not show any signs of fracture or misalignment of the lumbar spine.  X-ray does show degenerative changes of the lumbar spine as well as a 8 mm stone within the right kidney that is not obstructive.  Take ibuprofen every 6 hours for pain and inflammation.  Take Percocet every 6 hours as needed for breakthrough pain.  Additionally, you can get lidocaine patches over-the-counter to put on the area of pain as well for additional relief.  I provided information for orthopedics to follow-up with if your back pain persist.  Get help right away if: You develop new bowel or bladder control problems. You have unusual weakness or numbness in your arms or legs. You feel faint.

## 2023-07-28 NOTE — ED Provider Notes (Addendum)
 [place] [facility] AT [facility] Provider Note   CSN: [id number] Arrival date & time: 07/28/23  1046     History  Chief Complaint  Patient presents with   Back Pain    Linda Bradshaw is a 28 y.o. female with no significant past medical history presents the ED today for back pain.  Patient reports yesterday she was lifting a printer at work and started to have pain in the low back.  Took ibuprofen and used a lidocaine patch without improvement.  States that she woke up this morning with worse pain.  States that she had difficult time walking to the bathroom second to the pain.  Pain is worse when she stands straight up or when she moves.  No weakness, loss of sensation, numbness to the lower extremities.  No saddle anesthesia.  Denies any changes to urinary or bowel habits.  No prior back injuries.  No additional complaints or concerns at this time.     Home Medications Prior to Admission medications   Medication Sig Start Date End Date Taking? Authorizing Provider  ibuprofen (ADVIL) 600 MG tablet Take 1 tablet (600 mg total) by mouth every 6 (six) hours as needed for up to 7 days. 07/28/23 08/04/23 Yes Maxwell Marion, PA-C  oxyCODONE-acetaminophen (PERCOCET/ROXICET) 5-325 MG tablet Take 1 tablet by mouth every 6 (six) hours as needed for up to 5 days for severe pain (pain score 7-10). 07/28/23 08/02/23 Yes Maxwell Marion, PA-C  albuterol (VENTOLIN HFA) 108 (90 Base) MCG/ACT inhaler Inhale 2 puffs into the lungs every 4 (four) hours as needed for wheezing or shortness of breath. 07/20/23   Burgess Estelle, FNP  levonorgestrel (MIRENA) 20 MCG/24HR IUD 1 Intra Uterine Device by Intrauterine route once.    [provider]  promethazine-dextromethorphan (PROMETHAZINE-DM) 6.25-15 MG/5ML syrup Take 5 mLs by mouth 4 (four) times daily as needed for cough. 07/20/23   Burgess Estelle, FNP      Allergies    Patient has no known allergies.    Review of Systems   Review of  Systems  Musculoskeletal:  Positive for back pain.  All other systems reviewed and are negative.   Physical Exam Updated Vital Signs BP (!) 152/90 (BP Location: Right Arm)   Pulse 78   Temp 98 F (36.7 C) (Oral)   Resp 16   SpO2 100%  Physical Exam Vitals and nursing note reviewed.  Constitutional:      General: She is not in acute distress.    Appearance: Normal appearance.  HENT:     Head: Normocephalic and atraumatic.     Mouth/Throat:     Mouth: Mucous membranes are moist.  Eyes:     Conjunctiva/sclera: Conjunctivae normal.     Pupils: Pupils are equal, round, and reactive to light.  Cardiovascular:     Rate and Rhythm: Normal rate and regular rhythm.     Pulses: Normal pulses.  Pulmonary:     Effort: Pulmonary effort is normal.     Breath sounds: Normal breath sounds.  Abdominal:     Palpations: Abdomen is soft.     Tenderness: There is no abdominal tenderness.  Musculoskeletal:        General: Tenderness present. Normal range of motion.     Cervical back: Normal range of motion. No tenderness.     Comments: Midline tenderness to palpation of the lumbar spine without step-off or deformity  Strength, sensation, range of motion of upper lower extremities intact bilaterally  Skin:    General: Skin is warm and dry.     Findings: No rash.  Neurological:     General: No focal deficit present.     Mental Status: She is alert.     Sensory: No sensory deficit.     Motor: No weakness.  Psychiatric:        Mood and Affect: Mood normal.        Behavior: Behavior normal.    ED Results / Procedures / Treatments   Labs (all labs ordered are listed, but only abnormal results are displayed) Labs Reviewed  URINALYSIS, ROUTINE W REFLEX MICROSCOPIC - Abnormal; Notable for the following components:      Result Value   APPearance HAZY (*)    Protein, ur TRACE (*)    Leukocytes,Ua TRACE (*)    All other components within normal limits  URINE CULTURE  PREGNANCY, URINE     EKG None  Radiology CT Lumbar Spine Wo Contrast Result Date: 07/28/2023 CLINICAL DATA:  Trauma, lower back pain. EXAM: CT LUMBAR SPINE WITHOUT CONTRAST TECHNIQUE: Multidetector CT imaging of the lumbar spine was performed without intravenous contrast administration. Multiplanar CT image reconstructions were also generated. RADIATION DOSE REDUCTION: This exam was performed according to the departmental dose-optimization program which includes automated exposure control, adjustment of the mA and/or kV according to patient size and/or use of iterative reconstruction technique. COMPARISON:  None Available. FINDINGS: Segmentation: 5 lumbar type vertebrae. Alignment: There is straightening of the normal lumbar lordosis. No listhesis. Vertebrae: No compression fracture or displaced fracture in the lumbar spine. Schmorl's nodes at multiple levels in the lower thoracic spine and lumbar spine. No suspicious osseous lesion. There is asymmetric sclerosis along the left sacroiliac joint. Paraspinal and other soft tissues: The visualized paraspinal soft tissues are unremarkable. 8 mm calcified focus within the right kidney concerning for renal calculus. IUD within the partially visualized uterus. Disc levels: Mild intervertebral disc space narrowing at L5-S1. No large disc herniation. No high-grade spinal canal stenosis. No high-grade osseous foraminal stenosis. IMPRESSION: No acute fracture or traumatic malalignment of the lumbar spine. Mild asymmetric sclerosis of the left sacroiliac joint which could reflect early osteoarthritis, psoriatic arthritis or other inflammatory arthropathy. Recommend clinical correlation. Mild degenerative changes of the lumbar spine. 8 mm calculus in the right kidney. Electronically Signed   By: Emily Filbert M.D.   On: 07/28/2023 12:29    Procedures Procedures: not indicated.   Medications Ordered in ED Medications  ketorolac (TORADOL) 15 MG/ML injection 15 mg (15 mg  Intramuscular Given 07/28/23 1231)    ED Course/ Medical Decision Making/ A&P                                 Medical Decision Making Amount and/or Complexity of Data Reviewed Labs: ordered. Radiology: ordered.  Risk Prescription drug management.   This patient presents to the ED for concern of back pain, this involves an extensive number of treatment options, and is a complaint that carries with it a high risk of complications and morbidity.   Differential diagnosis includes: Fracture, dislocation, cauda equina syndrome, epidural abscess, muscle strain, muscle spasm, etc. No IVDU, low suspicion for epidural abscess No neurodeficits or urinary/bowel incontinence, low suspicion for cauda equina syndrome  Comorbidities  See HPI above   Additional History  Additional history obtained from prior records   Lab Tests  I ordered and personally interpreted labs.  The pertinent  results include:   UA shows trace leukocytes and trace protein.  11-20 squamous epithelial seen.  Contaminated specimen. Culture pending. Patient does not having urinary symptoms. Will not treat for UTI at this time. Negative pregnancy test   Imaging Studies  I ordered imaging studies including CT lumbar spine  I independently visualized and interpreted imaging which showed:  No acute fracture or traumatic malalignment of the lumbar spine. Mild asymmetric sclerosis of the left SI joint. Mild degenerative changes of the lumbar spine. 8 mm calculus in the right kidney. I agree with the radiologist interpretation   Problem List / ED Course / Critical Interventions / Medication Management  Low back pain that started yesterday at work after [employer].  Pain did not improve with ibuprofen or lidocaine patch.  Patient reports that the pain was worse this morning when she woke up.  Had difficulty ambulating to the bathroom second to the pain.  She is neurovascularly intact.  Suspicion for cauda equina  syndrome. I ordered medications including: Toradol for pain  Reevaluation of the patient after these medicines showed that the patient improved I have reviewed the patients home medicines and have made adjustments as needed   Social Determinants of Health  Physical activity   Test / Admission - Considered  Discussed findings with patient and mother at bedside.  All questions were answered. Patient is hemodynamically stable and safe for discharge home. Return precautions given.       Final Clinical Impression(s) / ED Diagnoses Final diagnoses:  Lumbar back pain  Muscle strain    Rx / DC Orders ED Discharge Orders          Ordered    oxyCODONE-acetaminophen (PERCOCET/ROXICET) 5-325 MG tablet  Every 6 hours PRN        07/28/23 1255    ibuprofen (ADVIL) 600 MG tablet  Every 6 hours PRN        07/28/23 1255              Maxwell Marion, PA-C 07/28/23 1303    Maxwell Marion, PA-C 07/28/23 1320    Rolan Bucco, MD 07/28/23 1353

## 2023-07-29 LAB — URINE CULTURE

## 2023-08-01 ENCOUNTER — Ambulatory Visit (HOSPITAL_BASED_OUTPATIENT_CLINIC_OR_DEPARTMENT_OTHER): Payer: BC Managed Care – PPO | Admitting: Student

## 2023-08-01 ENCOUNTER — Encounter (HOSPITAL_BASED_OUTPATIENT_CLINIC_OR_DEPARTMENT_OTHER): Payer: Self-pay | Admitting: Student

## 2023-08-01 VITALS — BP 128/62 | HR 92 | Temp 97.6°F | Ht 66.14 in | Wt 366.8 lb

## 2023-08-01 DIAGNOSIS — R5383 Other fatigue: Secondary | ICD-10-CM | POA: Insufficient documentation

## 2023-08-01 DIAGNOSIS — Z0000 Encounter for general adult medical examination without abnormal findings: Secondary | ICD-10-CM | POA: Diagnosis not present

## 2023-08-01 DIAGNOSIS — D509 Iron deficiency anemia, unspecified: Secondary | ICD-10-CM | POA: Insufficient documentation

## 2023-08-01 NOTE — Assessment & Plan Note (Signed)
 Concern for possible OSA, would not like an evaluation at this time.  - Assess vitamin D levels today - Assess B12 and Folate levels today

## 2023-08-01 NOTE — Progress Notes (Signed)
 Complete physical exam  Patient: Linda Bradshaw   DOB: 01-Mar-1996   28 y.o. Female  MRN: [id number]  Subjective:    Chief Complaint  Patient presents with   Annual Exam    Pt. Here for a annual exam.    Linda Bradshaw is a 28 y.o. female who presents today for a complete physical exam. She reports consuming a general diet. Has been meal prepping and trying to hit a calorie deficit. 375 to 366. Has been meal prepping breakfast, lunch, and dinner. The patient has a physically strenuous job, but has no regular exercise apart from work.  She generally feels well. She reports sleeping fairly well. She does not have additional problems to discuss today.   Pap smear- patient is not up to date on pap smear- encouraged to have this done at her OBGYN.  Most recent fall risk assessment:    08/01/2023    2:03 PM  Fall Risk   Falls in the past year? 0  Number falls in past yr: 0  Injury with Fall? 0  Risk for fall due to : No Fall Risks  Follow up Falls evaluation completed     Most recent depression screenings:    08/01/2023    2:03 PM 05/03/2023    3:11 PM  PHQ 2/9 Scores  PHQ - 2 Score 3 0  PHQ- 9 Score 11 10   Patient reports that she "feels fine". Suspect that this is a false positive due to sleep disturbances.    Patient Active Problem List   Diagnosis Date Noted   Iron deficiency anemia 08/01/2023   Fatigue 08/01/2023   Morbid obesity (HCC) 05/03/2023   History of iron deficiency 05/03/2023   Open left fibular fracture 11/25/2018   Past Medical History:  Diagnosis Date   Fracture of ankle 06/27/2019   Open Fx left ankle '20     No Known Allergies    Patient Care Team: Tieler Cournoyer, Ouida Sills as PCP - General (Physician Assistant)   Outpatient Medications Prior to Visit  Medication Sig   albuterol (VENTOLIN HFA) 108 (90 Base) MCG/ACT inhaler Inhale 2 puffs into the lungs every 4 (four) hours as needed for wheezing or shortness of breath.   ibuprofen  (ADVIL) 600 MG tablet Take 1 tablet (600 mg total) by mouth every 6 (six) hours as needed for up to 7 days.   levonorgestrel (MIRENA) 20 MCG/24HR IUD 1 Intra Uterine Device by Intrauterine route once.   promethazine-dextromethorphan (PROMETHAZINE-DM) 6.25-15 MG/5ML syrup Take 5 mLs by mouth 4 (four) times daily as needed for cough. (Patient not taking: Reported on 08/01/2023)   [DISCONTINUED] oxyCODONE-acetaminophen (PERCOCET/ROXICET) 5-325 MG tablet Take 1 tablet by mouth every 6 (six) hours as needed for up to 5 days for severe pain (pain score 7-10). (Patient not taking: Reported on 08/01/2023)   No facility-administered medications prior to visit.    ROS Per HPI    Objective:     BP 128/62   Pulse 92   Temp 97.6 F (36.4 C) (Oral)   Ht 5' 6.14" (1.68 m)   Wt (!) 366 lb 12.8 oz (166.4 kg)   SpO2 98%   BMI 58.95 kg/m  BP Readings from Last 3 Encounters:  08/01/23 128/62  07/28/23 (!) 163/98  07/20/23 132/87   Wt Readings from Last 3 Encounters:  08/01/23 (!) 366 lb 12.8 oz (166.4 kg)  05/03/23 (!) 375 lb 3.2 oz (170.2 kg)  06/22/22 (!) 345 lb 14.4 oz (  156.9 kg)      Physical Exam Constitutional:      General: She is not in acute distress.    Appearance: Normal appearance. She is not ill-appearing or diaphoretic.  HENT:     Head: Normocephalic and atraumatic.     Right Ear: Tympanic membrane, ear canal and external ear normal.     Left Ear: Tympanic membrane, ear canal and external ear normal.     Nose: Nose normal.     Mouth/Throat:     Mouth: Mucous membranes are moist.     Pharynx: Oropharynx is clear.  Eyes:     General: No scleral icterus.       Right eye: No discharge.        Left eye: No discharge.     Extraocular Movements: Extraocular movements intact.     Conjunctiva/sclera: Conjunctivae normal.     Pupils: Pupils are equal, round, and reactive to light.  Neck:     Thyroid: No thyroid mass, thyromegaly or thyroid tenderness.     Vascular: No carotid  bruit.  Cardiovascular:     Rate and Rhythm: Normal rate and regular rhythm.     Pulses: Normal pulses.     Heart sounds: Normal heart sounds. No murmur heard.    No friction rub. No gallop.  Pulmonary:     Effort: Pulmonary effort is normal.     Breath sounds: Normal breath sounds. No wheezing, rhonchi or rales.  Chest:     Chest wall: No tenderness.  Abdominal:     General: Bowel sounds are normal. There is no distension.     Palpations: Abdomen is soft.     Tenderness: There is no abdominal tenderness. There is no guarding.  Musculoskeletal:        General: No swelling, deformity or signs of injury.     Cervical back: Neck supple.     Right lower leg: No edema.     Left lower leg: No edema.  Lymphadenopathy:     Cervical: No cervical adenopathy.     Right cervical: No superficial or posterior cervical adenopathy.    Left cervical: No superficial cervical adenopathy.  Skin:    Coloration: Skin is not jaundiced.     Findings: No rash.  Neurological:     General: No focal deficit present.     Mental Status: She is alert and oriented to person, place, and time.     Deep Tendon Reflexes: Reflexes normal.  Psychiatric:        Behavior: Behavior normal.      No results found for any visits on 08/01/23. Last CBC Lab Results  Component Value Date   WBC 8.0 05/03/2023   HGB 13.5 05/03/2023   HCT 42.1 05/03/2023   MCV 78 (L) 05/03/2023   MCH 25.0 (L) 05/03/2023   RDW 13.6 05/03/2023   PLT 411 05/03/2023   Last metabolic panel Lab Results  Component Value Date   GLUCOSE 86 05/03/2023   NA 140 05/03/2023   K 4.2 05/03/2023   CL 101 05/03/2023   CO2 23 05/03/2023   BUN 12 05/03/2023   CREATININE 0.74 05/03/2023   EGFR 114 05/03/2023   CALCIUM 9.5 05/03/2023   PROT 7.1 05/03/2023   ALBUMIN 4.4 05/03/2023   LABGLOB 2.7 05/03/2023   BILITOT 0.2 05/03/2023   ALKPHOS 70 05/03/2023   AST 19 05/03/2023   ALT 15 05/03/2023   ANIONGAP 9 11/24/2018   Last lipids Lab  Results  Component Value  Date   CHOL 171 05/03/2023   HDL 40 05/03/2023   LDLCALC 112 (H) 05/03/2023   TRIG 105 05/03/2023   CHOLHDL 4.3 05/03/2023   Last hemoglobin A1c Lab Results  Component Value Date   HGBA1C 5.5 05/03/2023   Last vitamin D No results found for: "25OHVITD2", "25OHVITD3", "VD25OH" Last vitamin B12 and Folate No results found for: "VITAMINB12", "FOLATE"      Assessment & Plan:    Routine Health Maintenance and Physical Exam  Health Maintenance  Topic Date Due   HIV Screening  Never done   Hepatitis C Screening  Never done   Pap Smear  Never done   COVID-19 Vaccine (1 - 2024-25 season) 07/31/2024*   Flu Shot  11/18/2023   DTaP/Tdap/Td vaccine (3 - Td or Tdap) 06/13/2031   HPV Vaccine  Aged Out   Meningitis B Vaccine  Aged Out  *Topic was postponed. The date shown is not the original due date.    Encouraged her to engage in regular exercise appropriate for her age and condition.  Routine general medical examination at a health care facility  Fatigue, unspecified type Assessment & Plan: Concern for possible OSA, would not like an evaluation at this time.  - Assess vitamin D levels today - Assess B12 and Folate levels today  Orders: -     VITAMIN D 25 Hydroxy (Vit-D Deficiency, Fractures) -     B12 and Folate Panel  Iron deficiency anemia, unspecified iron deficiency anemia type Assessment & Plan: Patient has not yet gotten iron supplement. Encouraged to obtain iron supplement. - Continue eating iron rich foods - Begin iron supplementation - Reassess iron levels  Orders: -     Iron, TIBC and Ferritin Panel    Return in about 3 months (around 10/31/2023) for weight loss.     Teandre Hamre T Areona Homer, PA-C

## 2023-08-01 NOTE — Patient Instructions (Addendum)
 It was nice to see you today!  As we discussed in clinic:   Please continue to work on diet and exercise and we can disuss other options once you meet the 6 month mark.  If you have any problems before your next visit feel free to message me via MyChart (minor issues or questions) or call the office, otherwise you may reach out to schedule an office visit.  Thank you! Gerilyn Pilgrim Juanelle Trueheart, PA-C  Health Maintenance, Female Adopting a healthy lifestyle and getting preventive care are important in promoting health and wellness. Ask your health care provider about: The right schedule for you to have regular tests and exams. Things you can do on your own to prevent diseases and keep yourself healthy. What should I know about diet, weight, and exercise? Eat a healthy diet  Eat a diet that includes plenty of vegetables, fruits, low-fat dairy products, and lean protein. Do not eat a lot of foods that are high in solid fats, added sugars, or sodium. Maintain a healthy weight Body mass index (BMI) is used to identify weight problems. It estimates body fat based on height and weight. Your health care provider can help determine your BMI and help you achieve or maintain a healthy weight. Get regular exercise Get regular exercise. This is one of the most important things you can do for your health. Most adults should: Exercise for at least 150 minutes each week. The exercise should increase your heart rate and make you sweat (moderate-intensity exercise). Do strengthening exercises at least twice a week. This is in addition to the moderate-intensity exercise. Spend less time sitting. Even light physical activity can be beneficial. Watch cholesterol and blood lipids Have your blood tested for lipids and cholesterol at 28 years of age, then have this test every 5 years. Have your cholesterol levels checked more often if: Your lipid or cholesterol levels are high. You are older than 28 years of age. You are  at high risk for heart disease. What should I know about cancer screening? Depending on your health history and family history, you may need to have cancer screening at various ages. This may include screening for: Breast cancer. Cervical cancer. Colorectal cancer. Skin cancer. Lung cancer. What should I know about heart disease, diabetes, and high blood pressure? Blood pressure and heart disease High blood pressure causes heart disease and increases the risk of stroke. This is more likely to develop in people who have high blood pressure readings or are overweight. Have your blood pressure checked: Every 3-5 years if you are 57-53 years of age. Every year if you are 48 years old or older. Diabetes Have regular diabetes screenings. This checks your fasting blood sugar level. Have the screening done: Once every three years after age 61 if you are at a normal weight and have a low risk for diabetes. More often and at a younger age if you are overweight or have a high risk for diabetes. What should I know about preventing infection? Hepatitis B If you have a higher risk for hepatitis B, you should be screened for this virus. Talk with your health care provider to find out if you are at risk for hepatitis B infection. Hepatitis C Testing is recommended for: Everyone born from [birth year] through 1965. Anyone with known risk factors for hepatitis C. Sexually transmitted infections (STIs) Get screened for STIs, including gonorrhea and chlamydia, if: You are sexually active and are younger than 28 years of age. You are older than  28 years of age and your health care provider tells you that you are at risk for this type of infection. Your sexual activity has changed since you were last screened, and you are at increased risk for chlamydia or gonorrhea. Ask your health care provider if you are at risk. Ask your health care provider about whether you are at high risk for HIV. Your health care provider  may recommend a prescription medicine to help prevent HIV infection. If you choose to take medicine to prevent HIV, you should first get tested for HIV. You should then be tested every 3 months for as long as you are taking the medicine. Pregnancy If you are about to stop having your period (premenopausal) and you may become pregnant, seek counseling before you get pregnant. Take 400 to 800 micrograms (mcg) of folic acid every day if you become pregnant. Ask for birth control (contraception) if you want to prevent pregnancy. Osteoporosis and menopause Osteoporosis is a disease in which the bones lose minerals and strength with aging. This can result in bone fractures. If you are 36 years old or older, or if you are at risk for osteoporosis and fractures, ask your health care provider if you should: Be screened for bone loss. Take a calcium or vitamin D supplement to lower your risk of fractures. Be given hormone replacement therapy (HRT) to treat symptoms of menopause. Follow these instructions at home: Alcohol use Do not drink alcohol if: Your health care provider tells you not to drink. You are pregnant, may be pregnant, or are planning to become pregnant. If you drink alcohol: Limit how much you have to: 0-1 drink a day. Know how much alcohol is in your drink. In the [place] one drink equals one 12 oz bottle of beer (355 mL), one 5 oz glass of wine (148 mL), or one 1 oz glass of hard liquor (44 mL). Lifestyle Do not use any products that contain nicotine or tobacco. These products include cigarettes, chewing tobacco, and vaping devices, such as e-cigarettes. If you need help quitting, ask your health care provider. Do not use street drugs. Do not share needles. Ask your health care provider for help if you need support or information about quitting drugs. General instructions Schedule regular health, dental, and eye exams. Stay current with your vaccines. Tell your health care provider  if: You often feel depressed. You have ever been abused or do not feel safe at home. Summary Adopting a healthy lifestyle and getting preventive care are important in promoting health and wellness. Follow your health care provider's instructions about healthy diet, exercising, and getting tested or screened for diseases. Follow your health care provider's instructions on monitoring your cholesterol and blood pressure. This information is not intended to replace advice given to you by your health care provider. Make sure you discuss any questions you have with your health care provider. Document Revised: 08/25/2020 Document Reviewed: 08/25/2020 Elsevier Patient Education  2024 Elsevier Inc.    Why follow it? Research shows. Those who follow the Mediterranean diet have a reduced risk of heart disease  The diet is associated with a reduced incidence of Parkinson's and Alzheimer's diseases People following the diet may have longer life expectancies and lower rates of chronic diseases  The Dietary Guidelines for Americans recommends the Mediterranean diet as an eating plan to promote health and prevent disease  What Is the Mediterranean Diet?  Healthy eating plan based on typical foods and recipes of Mediterranean-style cooking The diet  is primarily a plant based diet; these foods should make up a majority of meals   Starches - Plant based foods should make up a majority of meals - They are an important sources of vitamins, minerals, energy, antioxidants, and fiber - Choose whole grains, foods high in fiber and minimally processed items  - Typical grain sources include wheat, oats, barley, corn, brown rice, bulgar, farro, millet, polenta, couscous  - Various types of beans include chickpeas, lentils, fava beans, black beans, white beans   Fruits  Veggies - Large quantities of antioxidant rich fruits & veggies; 6 or more servings  - Vegetables can be eaten raw or lightly drizzled with oil and  cooked  - Vegetables common to the traditional Mediterranean Diet include: artichokes, arugula, beets, broccoli, brussel sprouts, cabbage, carrots, celery, collard greens, cucumbers, eggplant, kale, leeks, lemons, lettuce, mushrooms, okra, onions, peas, peppers, potatoes, pumpkin, radishes, rutabaga, shallots, spinach, sweet potatoes, turnips, zucchini - Fruits common to the Mediterranean Diet include: apples, apricots, avocados, cherries, clementines, dates, figs, grapefruits, grapes, melons, nectarines, oranges, peaches, pears, pomegranates, strawberries, tangerines  Fats - Replace butter and margarine with healthy oils, such as olive oil, canola oil, and tahini  - Limit nuts to no more than a handful a day  - Nuts include walnuts, almonds, pecans, pistachios, pine nuts  - Limit or avoid candied, honey roasted or heavily salted nuts - Olives are central to the [employer] - can be eaten whole or used in a variety of dishes   Meats Protein - Limiting red meat: no more than a few times a month - When eating red meat: choose lean cuts and keep the portion to the size of deck of cards - Eggs: approx. 0 to 4 times a week  - Fish and lean poultry: at least 2 a week  - Healthy protein sources include, chicken, [place], lean beef, lamb - Increase intake of seafood such as tuna, salmon, trout, mackerel, shrimp, scallops - Avoid or limit high fat processed meats such as sausage and bacon  Dairy - Include moderate amounts of low fat dairy products  - Focus on healthy dairy such as fat free yogurt, skim milk, low or reduced fat cheese - Limit dairy products higher in fat such as whole or 2% milk, cheese, ice cream  Alcohol - Moderate amounts of red wine is ok  - No more than 5 oz daily for women (all ages) and men older than age 81  - No more than 10 oz of wine daily for men younger than 76  Other - Limit sweets and other desserts  - Use herbs and spices instead of salt to flavor foods  - Herbs  and spices common to the traditional Mediterranean Diet include: basil, bay leaves, chives, cloves, cumin, fennel, garlic, lavender, marjoram, mint, oregano, parsley, pepper, rosemary, sage, savory, sumac, tarragon, thyme   It's not just a diet, it's a lifestyle:  The Mediterranean diet includes lifestyle factors typical of those in the region  Foods, drinks and meals are best eaten with others and savored Daily physical activity is important for overall good health This could be strenuous exercise like running and aerobics This could also be more leisurely activities such as walking, housework, yard-work, or taking the stairs Moderation is the key; a balanced and healthy diet accommodates most foods and drinks Consider portion sizes and frequency of consumption of certain foods   Meal Ideas & Options:  Breakfast:  Whole wheat toast or whole wheat [place]  muffins with peanut butter & hard boiled egg Steel cut oats topped with apples & cinnamon and skim milk  Fresh fruit: banana, strawberries, melon, berries, peaches  Smoothies: strawberries, bananas, greek yogurt, peanut butter Low fat greek yogurt with blueberries and granola  Egg white omelet with spinach and mushrooms Breakfast couscous: whole wheat couscous, apricots, skim milk, cranberries  Sandwiches:  Hummus and grilled vegetables (peppers, zucchini, squash) on whole wheat bread   Grilled chicken on whole wheat pita with lettuce, tomatoes, cucumbers or tzatziki  [place] salad on whole wheat bread: tuna salad made with greek yogurt, olives, red peppers, capers, green onions Garlic rosemary lamb pita: lamb sauted with garlic, rosemary, salt & pepper; add lettuce, cucumber, greek yogurt to pita - flavor with lemon juice and black pepper  Seafood:  Mediterranean grilled salmon, seasoned with garlic, basil, parsley, lemon juice and black pepper Shrimp, lemon, and spinach whole-grain pasta salad made with low fat greek yogurt  Seared  scallops with lemon orzo  Seared tuna steaks seasoned salt, pepper, coriander topped with tomato mixture of olives, tomatoes, olive oil, minced garlic, parsley, green onions and cappers  Meats:  Herbed greek chicken salad with kalamata olives, cucumber, feta  Red bell peppers stuffed with spinach, bulgur, lean ground beef (or lentils) & topped with feta   Kebabs: skewers of chicken, tomatoes, onions, zucchini, squash  [place] burgers: made with red onions, mint, dill, lemon juice, feta cheese topped with roasted red peppers Vegetarian Cucumber salad: cucumbers, artichoke hearts, celery, red onion, feta cheese, tossed in olive oil & lemon juice  Hummus and whole grain pita points with a greek salad (lettuce, tomato, feta, olives, cucumbers, red onion) Lentil soup with celery, carrots made with vegetable broth, garlic, salt and pepper  Tabouli salad: parsley, bulgur, mint, scallions, cucumbers, tomato, radishes, lemon juice, olive oil, salt and pepper.      American Heart Association (AHA) Exercise Recommendation  Being physically active is important to prevent heart disease and stroke, the nation's No. 1and No. 5killers. To improve overall cardiovascular health, we suggest at least 150 minutes per week of moderate exercise or 75 minutes per week of vigorous exercise (or a combination of moderate and vigorous activity). Thirty minutes a day, five times a week is an easy goal to remember. You will also experience benefits even if you divide your time into two or three segments of 10 to 15 minutes per day.  For people who would benefit from lowering their blood pressure or cholesterol, we recommend 40 minutes of aerobic exercise of moderate to vigorous intensity three to four times a week to lower the risk for heart attack and stroke.  Physical activity is anything that makes you move your body and burn calories.  This includes things like climbing stairs or playing sports. Aerobic exercises benefit  your heart, and include walking, jogging, swimming or biking. Strength and stretching exercises are best for overall stamina and flexibility.  The simplest, positive change you can make to effectively improve your heart health is to start walking. It's enjoyable, free, easy, social and great exercise. A walking program is flexible and boasts high success rates because people can stick with it. It's easy for walking to become a regular and satisfying part of life.   For Overall Cardiovascular Health: At least 30 minutes of moderate-intensity aerobic activity at least 5 days per week for a total of 150  OR  At least 25 minutes of vigorous aerobic activity at least 3 days per week  for a total of 75 minutes; or a combination of moderate- and vigorous-intensity aerobic activity  AND  Moderate- to high-intensity muscle-strengthening activity at least 2 days per week for additional health benefits.  For Lowering Blood Pressure and Cholesterol An average 40 minutes of moderate- to vigorous-intensity aerobic activity 3 or 4 times per week  What if I can't make it to the time goal? Something is always better than nothing! And everyone has to start somewhere. Even if you've been sedentary for years, today is the day you can begin to make healthy changes in your life. If you don't think you'll make it for 30 or 40 minutes, set a reachable goal for today. You can work up toward your overall goal by increasing your time as you get stronger. Don't let all-or-nothing thinking rob you of doing what you can every day.  Source:http://www.heart.org

## 2023-08-01 NOTE — Assessment & Plan Note (Signed)
 Patient has not yet gotten iron supplement. Encouraged to obtain iron supplement. - Continue eating iron rich foods - Begin iron supplementation - Reassess iron levels

## 2023-08-02 ENCOUNTER — Other Ambulatory Visit (HOSPITAL_BASED_OUTPATIENT_CLINIC_OR_DEPARTMENT_OTHER): Payer: Self-pay | Admitting: Student

## 2023-08-02 ENCOUNTER — Encounter (HOSPITAL_BASED_OUTPATIENT_CLINIC_OR_DEPARTMENT_OTHER): Payer: Self-pay | Admitting: Student

## 2023-08-02 DIAGNOSIS — E559 Vitamin D deficiency, unspecified: Secondary | ICD-10-CM

## 2023-08-02 LAB — VITAMIN D 25 HYDROXY (VIT D DEFICIENCY, FRACTURES): Vit D, 25-Hydroxy: 15.1 ng/mL — ABNORMAL LOW (ref 30.0–100.0)

## 2023-08-02 LAB — IRON,TIBC AND FERRITIN PANEL
Ferritin: 48 ng/mL (ref 15–150)
Iron Saturation: 11 % — ABNORMAL LOW (ref 15–55)
Iron: 39 ug/dL (ref 27–159)
Total Iron Binding Capacity: 348 ug/dL (ref 250–450)
UIBC: 309 ug/dL (ref 131–425)

## 2023-08-02 LAB — B12 AND FOLATE PANEL
Folate: 5.2 ng/mL (ref >3.0)
Vitamin B-12: 347 pg/mL (ref 232–1245)

## 2023-08-02 MED ORDER — VITAMIN D (ERGOCALCIFEROL) 1.25 MG (50000 UNIT) PO CAPS: 50000.0000 [IU] | ORAL_CAPSULE | ORAL | 5 refills | Status: DC

## 2023-10-31 ENCOUNTER — Encounter (HOSPITAL_BASED_OUTPATIENT_CLINIC_OR_DEPARTMENT_OTHER): Payer: Self-pay | Admitting: Student

## 2023-10-31 ENCOUNTER — Ambulatory Visit (HOSPITAL_BASED_OUTPATIENT_CLINIC_OR_DEPARTMENT_OTHER): Admitting: Student

## 2023-10-31 DIAGNOSIS — E611 Iron deficiency: Secondary | ICD-10-CM | POA: Diagnosis not present

## 2023-10-31 DIAGNOSIS — E559 Vitamin D deficiency, unspecified: Secondary | ICD-10-CM | POA: Insufficient documentation

## 2023-10-31 NOTE — Assessment & Plan Note (Signed)
 Chronic/recurrent, stable on iron therapy. Currently taking iron supplements every other day, reporting increased energy with consistent use. Previous labs indicated low iron saturation, necessitating re-evaluation of current iron levels. - Recheck iron levels with lab work today - Continue taking iron supplements every other day

## 2023-10-31 NOTE — Assessment & Plan Note (Signed)
 Chronic/stable. They have successfully lost 20 pounds over the past three months through exercise and portion control. Currently engaging in regular physical activity, including walking and lifting heavy objects at work, contributing to weight loss. Considering continuing current regimen without pharmacological intervention. Discussed potential pharmacological options: phentermine (stimulant, increases pulse and blood pressure), metformin (weight loss effects), and Topamax (adjunct to phentermine, side effects include altered taste and word-finding difficulties). Prefers to continue current regimen and will consider pharmacological options if needed in the future. - Continue current exercise and portion control regimen - Consider referral to a nutritionist if weight loss plateaus - Provide information on a provider referral exercise program at the [facility] - Discuss potential pharmacological options (phentermine, metformin, Topamax) if needed in the future

## 2023-10-31 NOTE — Progress Notes (Signed)
 Established Patient Office Visit  Subjective   Patient ID: Linda Bradshaw, female    DOB: 1996-02-21  Age: 28 y.o. MRN: [id number]  Chief Complaint  Patient presents with   Medical Management of Chronic Issues    Follow up. Insurance told pt they do not cover weight loss under her plan.    HPI  Discussed the use of AI scribe software for clinical note transcription with the patient, who gave verbal consent to proceed.  History of Present Illness   The patient presents for follow-up regarding weight loss and iron supplementation.  They have experienced a weight loss of 20 pounds over the past three months, attributed to portion control and regular physical activity, including walking and lifting heavy objects at work. Their work is physically demanding, causing frequent sweating.  They have been taking over-the-counter iron supplements every other day and report feeling more energized with consistent use over a week.  They have been taking vitamin D  as prescribed but have run out of the medication.  No history of heart issues and no family history of early cardiac death, except for their maternal grandmother, who passed away when their mother was [age] years old. The cause of death is unknown.  No new issues since the last visit. They mention that one leg is always swollen, attributed to work activities, and deny any other swelling in their legs.        Patient Active Problem List   Diagnosis Date Noted   Vitamin D  deficiency 10/31/2023   Iron deficiency 10/31/2023   Iron deficiency anemia 08/01/2023   Fatigue 08/01/2023   Morbid obesity (HCC) 05/03/2023   History of iron deficiency 05/03/2023   Open left fibular fracture 11/25/2018   Past Medical History:  Diagnosis Date   Fracture of ankle 06/27/2019   Open Fx left ankle '20     Social History   Tobacco Use   Smoking status: Never    Passive exposure: Never   Smokeless tobacco: Never  Vaping Use   Vaping  status: Never Used  Substance Use Topics   Alcohol use: Yes    Comment: occasionally   Drug use: Never   No Known Allergies    ROS Per HPI.    Objective:     BP 134/84   Pulse 80   Temp 98.1 F (36.7 C) (Oral)   Resp 16   Ht 5' 6 (1.676 m)   Wt (!) 345 lb 9.6 oz (156.8 kg)   LMP 10/28/2023 (Approximate)   SpO2 98%   BMI 55.78 kg/m  BP Readings from Last 3 Encounters:  10/31/23 134/84  08/01/23 128/62  07/28/23 (!) 163/98   Wt Readings from Last 3 Encounters:  10/31/23 (!) 345 lb 9.6 oz (156.8 kg)  08/01/23 (!) 366 lb 12.8 oz (166.4 kg)  05/03/23 (!) 375 lb 3.2 oz (170.2 kg)      Physical Exam Constitutional:      General: She is not in acute distress.    Appearance: Normal appearance. She is not ill-appearing.  HENT:     Head: Normocephalic and atraumatic.     Nose: Nose normal.  Eyes:     General: No scleral icterus.    Conjunctiva/sclera: Conjunctivae normal.  Cardiovascular:     Rate and Rhythm: Normal rate and regular rhythm.     Heart sounds: Normal heart sounds. No murmur heard.    No friction rub.  Pulmonary:     Effort: Pulmonary effort is normal. No  respiratory distress.     Breath sounds: Normal breath sounds. No wheezing, rhonchi or rales.  Musculoskeletal:        General: Normal range of motion.     Right lower leg: No edema.     Left lower leg: No edema.  Skin:    General: Skin is warm and dry.     Coloration: Skin is not jaundiced or pale.  Neurological:     General: No focal deficit present.     Mental Status: She is alert.  Psychiatric:        Mood and Affect: Mood normal.        Behavior: Behavior normal.      Results for orders placed or performed in visit on 10/31/23  HM PAP SMEAR  Result Value Ref Range   HM Pap smear negative     Last CBC Lab Results  Component Value Date   WBC 8.0 05/03/2023   HGB 13.5 05/03/2023   HCT 42.1 05/03/2023   MCV 78 (L) 05/03/2023   MCH 25.0 (L) 05/03/2023   RDW 13.6 05/03/2023    PLT 411 05/03/2023   Last metabolic panel Lab Results  Component Value Date   GLUCOSE 86 05/03/2023   NA 140 05/03/2023   K 4.2 05/03/2023   CL 101 05/03/2023   CO2 23 05/03/2023   BUN 12 05/03/2023   CREATININE 0.74 05/03/2023   EGFR 114 05/03/2023   CALCIUM 9.5 05/03/2023   PROT 7.1 05/03/2023   ALBUMIN 4.4 05/03/2023   LABGLOB 2.7 05/03/2023   BILITOT 0.2 05/03/2023   ALKPHOS 70 05/03/2023   AST 19 05/03/2023   ALT 15 05/03/2023   ANIONGAP 9 11/24/2018   Last lipids Lab Results  Component Value Date   CHOL 171 05/03/2023   HDL 40 05/03/2023   LDLCALC 112 (H) 05/03/2023   TRIG 105 05/03/2023   CHOLHDL 4.3 05/03/2023   Last hemoglobin A1c Lab Results  Component Value Date   HGBA1C 5.5 05/03/2023      The ASCVD Risk score (Arnett DK, et al., 2019) failed to calculate for the following reasons:   The 2019 ASCVD risk score is only valid for ages 69 to 107    Assessment & Plan:   Morbid obesity (HCC) Assessment & Plan: Chronic/stable. They have successfully lost 20 pounds over the past three months through exercise and portion control. Currently engaging in regular physical activity, including walking and lifting heavy objects at work, contributing to weight loss. Considering continuing current regimen without pharmacological intervention. Discussed potential pharmacological options: phentermine (stimulant, increases pulse and blood pressure), metformin (weight loss effects), and Topamax (adjunct to phentermine, side effects include altered taste and word-finding difficulties). Prefers to continue current regimen and will consider pharmacological options if needed in the future. - Continue current exercise and portion control regimen - Consider referral to a nutritionist if weight loss plateaus - Provide information on a provider referral exercise program at the [facility] - Discuss potential pharmacological options (phentermine, metformin, Topamax) if needed in the  future   Vitamin D  deficiency Assessment & Plan: Chronic, stable. Completed prescribed course of vitamin D  supplementation. Need to reassess vitamin D  levels to determine if further supplementation is required, as some individuals may require ongoing or increased dosing. - Recheck vitamin D  levels with lab work today - Consider continuation or adjustment of vitamin D  supplementation based on lab results   Orders: -     VITAMIN D  25 Hydroxy (Vit-D Deficiency, Fractures)  Iron deficiency  Assessment & Plan: Chronic/recurrent, stable on iron therapy. Currently taking iron supplements every other day, reporting increased energy with consistent use. Previous labs indicated low iron saturation, necessitating re-evaluation of current iron levels. - Recheck iron levels with lab work today - Continue taking iron supplements every other day  Orders: -     CBC with Differential/Platelet -     Iron, TIBC and Ferritin Panel  s Return in about 6 months (around 05/02/2024) for weight loss.    Coline Calkin T Jakie Debow, PA-C

## 2023-10-31 NOTE — Patient Instructions (Signed)
 It was nice to see you today!  As we discussed in clinic:  Please let me know if you would like to do the PREP program.  Please follow up with your OBGYN to have your PAP smear done.  If you have any problems before your next visit feel free to message me via MyChart (minor issues or questions) or call the office, otherwise you may reach out to schedule an office visit.  Thank you! Piera Downs, PA-C

## 2023-10-31 NOTE — Assessment & Plan Note (Signed)
 Chronic, stable. Completed prescribed course of vitamin D  supplementation. Need to reassess vitamin D  levels to determine if further supplementation is required, as some individuals may require ongoing or increased dosing. - Recheck vitamin D  levels with lab work today - Consider continuation or adjustment of vitamin D  supplementation based on lab results

## 2023-11-01 ENCOUNTER — Ambulatory Visit (HOSPITAL_BASED_OUTPATIENT_CLINIC_OR_DEPARTMENT_OTHER): Payer: Self-pay | Admitting: Student

## 2023-11-01 ENCOUNTER — Other Ambulatory Visit (HOSPITAL_BASED_OUTPATIENT_CLINIC_OR_DEPARTMENT_OTHER): Payer: Self-pay

## 2023-11-01 DIAGNOSIS — E559 Vitamin D deficiency, unspecified: Secondary | ICD-10-CM

## 2023-11-01 LAB — CBC WITH DIFFERENTIAL/PLATELET
Basophils Absolute: 0 x10E3/uL (ref 0.0–0.2)
Basos: 0 %
EOS (ABSOLUTE): 0 x10E3/uL (ref 0.0–0.4)
Eos: 1 %
Hematocrit: 44.2 % (ref 34.0–46.6)
Hemoglobin: 13.8 g/dL (ref 11.1–15.9)
Immature Grans (Abs): 0 x10E3/uL (ref 0.0–0.1)
Immature Granulocytes: 0 %
Lymphocytes Absolute: 2.3 x10E3/uL (ref 0.7–3.1)
Lymphs: 26 %
MCH: 25.6 pg — ABNORMAL LOW (ref 26.6–33.0)
MCHC: 31.2 g/dL — ABNORMAL LOW (ref 31.5–35.7)
MCV: 82 fL (ref 79–97)
Monocytes Absolute: 0.5 x10E3/uL (ref 0.1–0.9)
Monocytes: 6 %
Neutrophils Absolute: 5.9 x10E3/uL (ref 1.4–7.0)
Neutrophils: 67 %
Platelets: 338 x10E3/uL (ref 150–450)
RBC: 5.39 x10E6/uL — ABNORMAL HIGH (ref 3.77–5.28)
RDW: 13.5 % (ref 11.7–15.4)
WBC: 8.8 x10E3/uL (ref 3.4–10.8)

## 2023-11-01 LAB — IRON,TIBC AND FERRITIN PANEL
Ferritin: 34 ng/mL (ref 15–150)
Iron Saturation: 9 % — CL (ref 15–55)
Iron: 33 ug/dL (ref 27–159)
Total Iron Binding Capacity: 368 ug/dL (ref 250–450)
UIBC: 335 ug/dL (ref 131–425)

## 2023-11-01 LAB — VITAMIN D 25 HYDROXY (VIT D DEFICIENCY, FRACTURES): Vit D, 25-Hydroxy: 19.4 ng/mL — ABNORMAL LOW (ref 30.0–100.0)

## 2023-11-01 MED ORDER — VITAMIN D (ERGOCALCIFEROL) 1.25 MG (50000 UNIT) PO CAPS: 50000.0000 [IU] | ORAL_CAPSULE | ORAL | 5 refills | Status: AC

## 2023-11-10 ENCOUNTER — Emergency Department (HOSPITAL_BASED_OUTPATIENT_CLINIC_OR_DEPARTMENT_OTHER): Admission: EM | Admit: 2023-11-10 | Discharge: 2023-11-10 | Disposition: A | Discharge status: Home / Self Care

## 2023-11-10 ENCOUNTER — Other Ambulatory Visit: Payer: Self-pay

## 2023-11-10 ENCOUNTER — Encounter (HOSPITAL_BASED_OUTPATIENT_CLINIC_OR_DEPARTMENT_OTHER): Payer: Self-pay

## 2023-11-10 DIAGNOSIS — R202 Paresthesia of skin: Secondary | ICD-10-CM | POA: Insufficient documentation

## 2023-11-10 DIAGNOSIS — F410 Panic disorder [episodic paroxysmal anxiety] without agoraphobia: Secondary | ICD-10-CM | POA: Insufficient documentation

## 2023-11-10 LAB — CBC WITH DIFFERENTIAL/PLATELET
Abs Immature Granulocytes: 0.02 K/uL (ref 0.00–0.07)
Basophils Absolute: 0 K/uL (ref 0.0–0.1)
Basophils Relative: 0 %
Eosinophils Absolute: 0 K/uL (ref 0.0–0.5)
Eosinophils Relative: 0 %
HCT: 43.7 % (ref 36.0–46.0)
Hemoglobin: 14.5 g/dL (ref 12.0–15.0)
Immature Granulocytes: 0 %
Lymphocytes Relative: 20 %
Lymphs Abs: 2.2 K/uL (ref 0.7–4.0)
MCH: 25.8 pg — ABNORMAL LOW (ref 26.0–34.0)
MCHC: 33.2 g/dL (ref 30.0–36.0)
MCV: 77.6 fL — ABNORMAL LOW (ref 80.0–100.0)
Monocytes Absolute: 0.7 K/uL (ref 0.1–1.0)
Monocytes Relative: 7 %
Neutro Abs: 7.8 K/uL — ABNORMAL HIGH (ref 1.7–7.7)
Neutrophils Relative %: 73 %
Platelets: 324 K/uL (ref 150–400)
RBC: 5.63 MIL/uL — ABNORMAL HIGH (ref 3.87–5.11)
RDW: 13.2 % (ref 11.5–15.5)
WBC: 10.8 K/uL — ABNORMAL HIGH (ref 4.0–10.5)
nRBC: 0 % (ref 0.0–0.2)

## 2023-11-10 LAB — URINALYSIS, ROUTINE W REFLEX MICROSCOPIC
Glucose, UA: NEGATIVE mg/dL
Hgb urine dipstick: NEGATIVE
Ketones, ur: >80 mg/dL — AB
Nitrite: NEGATIVE
Protein, ur: 30 mg/dL — AB
Specific Gravity, Urine: 1.038 — ABNORMAL HIGH (ref 1.005–1.030)
pH: 5.5 (ref 5.0–8.0)

## 2023-11-10 LAB — BASIC METABOLIC PANEL WITH GFR
Anion gap: 16 — ABNORMAL HIGH (ref 5–15)
BUN: 14 mg/dL (ref 6–20)
CO2: 21 mmol/L — ABNORMAL LOW (ref 22–32)
Calcium: 10.1 mg/dL (ref 8.9–10.3)
Chloride: 103 mmol/L (ref 98–111)
Creatinine, Ser: 0.84 mg/dL (ref 0.44–1.00)
GFR, Estimated: >60 mL/min (ref >60)
Glucose, Bld: 91 mg/dL (ref 70–99)
Potassium: 3.9 mmol/L (ref 3.5–5.1)
Sodium: 140 mmol/L (ref 135–145)

## 2023-11-10 LAB — MAGNESIUM: Magnesium: 1.9 mg/dL (ref 1.7–2.4)

## 2023-11-10 MED ORDER — ONDANSETRON HCL 4 MG PO TABS: 4.0000 mg | ORAL_TABLET | Freq: Three times a day (TID) | ORAL | 0 refills | Status: AC | PRN

## 2023-11-10 MED ORDER — SODIUM CHLORIDE 0.9 % IV BOLUS
1000.0000 mL | Freq: Once | INTRAVENOUS | Status: AC
  Administered 2023-11-10: 1000 mL via INTRAVENOUS

## 2023-11-10 NOTE — ED Notes (Signed)
 AVS provided by edp was reviewed with the pt. Pt verbalized understanding with no additional questions at this time. Pharmacy verified with pt. Pt going home with family at bedside.

## 2023-11-10 NOTE — ED Provider Notes (Signed)
 [place] EMERGENCY DEPARTMENT AT [facility] Provider Note   CSN: [id number] Arrival date & time: 11/10/23  2000     Patient presents with: Numbness (/)   Linda Bradshaw is a 28 y.o. female.   28 year old female presents for evaluation of numbness and tingling.  She states she had some nausea and vomiting earlier today.  States she developed bilateral facial numbness and tongue as well as bilateral hand numbness.  She states her symptoms have resolved at this time and she is feeling much better.  States she has not eaten much today.  Denies any other symptoms or concerns.        Prior to Admission medications   Medication Sig Start Date End Date Taking? Authorizing Provider  ondansetron  (ZOFRAN ) 4 MG tablet Take 1 tablet (4 mg total) by mouth every 8 (eight) hours as needed for up to 4 days for nausea or vomiting. 11/10/23 11/14/23 Yes Shanea Karney L, DO  albuterol  (VENTOLIN  HFA) 108 (90 Base) MCG/ACT inhaler Inhale 2 puffs into the lungs every 4 (four) hours as needed for wheezing or shortness of breath. 07/20/23   Janet Therisa PARAS, FNP  ferrous sulfate 325 (65 FE) MG tablet Take 325 mg by mouth every other day.    [provider]  levonorgestrel (MIRENA) 20 MCG/24HR IUD 1 Intra Uterine Device by Intrauterine route once.    [provider]  Vitamin D , Ergocalciferol , (DRISDOL ) 1.25 MG (50000 UNIT) CAPS capsule Take 1 capsule (50,000 Units total) by mouth every 7 (seven) days. 11/01/23   Rothfuss, Jacob T, PA-C    Allergies: Patient has no known allergies.    Review of Systems  Constitutional:  Negative for chills and fever.  HENT:  Negative for ear pain and sore throat.   Eyes:  Negative for pain and visual disturbance.  Respiratory:  Negative for cough and shortness of breath.   Cardiovascular:  Negative for chest pain and palpitations.  Gastrointestinal:  Negative for abdominal pain and vomiting.  Genitourinary:  Negative for dysuria and  hematuria.  Musculoskeletal:  Negative for arthralgias and back pain.  Skin:  Negative for color change and rash.  Neurological:  Positive for numbness. Negative for seizures and syncope.  All other systems reviewed and are negative.   Updated Vital Signs BP (!) 154/85   Pulse 82   Temp 97.8 F (36.6 C) (Oral)   Resp 19   Ht 5' 6 (1.676 m)   Wt (!) 156 kg   LMP 10/28/2023 (Approximate)   SpO2 100%   BMI 55.52 kg/m   Physical Exam Vitals and nursing note reviewed.  Constitutional:      General: She is not in acute distress.    Appearance: She is well-developed.  HENT:     Head: Normocephalic and atraumatic.  Eyes:     Conjunctiva/sclera: Conjunctivae normal.  Cardiovascular:     Rate and Rhythm: Normal rate and regular rhythm.     Heart sounds: No murmur heard. Pulmonary:     Effort: Pulmonary effort is normal. No respiratory distress.     Breath sounds: Normal breath sounds.  Abdominal:     Palpations: Abdomen is soft.     Tenderness: There is no abdominal tenderness.  Musculoskeletal:        General: No swelling.     Cervical back: Neck supple.  Skin:    General: Skin is warm and dry.     Capillary Refill: Capillary refill takes less than 2 seconds.  Neurological:  General: No focal deficit present.     Mental Status: She is alert.  Psychiatric:        Mood and Affect: Mood normal.     (all labs ordered are listed, but only abnormal results are displayed) Labs Reviewed  CBC WITH DIFFERENTIAL/PLATELET - Abnormal; Notable for the following components:      Result Value   WBC 10.8 (*)    RBC 5.63 (*)    MCV 77.6 (*)    MCH 25.8 (*)    Neutro Abs 7.8 (*)    All other components within normal limits  BASIC METABOLIC PANEL WITH GFR - Abnormal; Notable for the following components:   CO2 21 (*)    Anion gap 16 (*)    All other components within normal limits  URINALYSIS, ROUTINE W REFLEX MICROSCOPIC - Abnormal; Notable for the following components:    APPearance HAZY (*)    Specific Gravity, Urine 1.038 (*)    Bilirubin Urine SMALL (*)    Ketones, ur >80 (*)    Protein, ur 30 (*)    Leukocytes,Ua TRACE (*)    Bacteria, UA RARE (*)    All other components within normal limits  MAGNESIUM    EKG: EKG Interpretation Date/Time:  Thursday November 10 2023 20:17:21 EDT Ventricular Rate:  91 PR Interval:  124 QRS Duration:  83 QT Interval:  358 QTC Calculation: 441 R Axis:   73  Text Interpretation: Sinus rhythm Normal axis no STEMI No previous EKG for comparison Confirmed by Gennaro Bouchard ([id number]) on 11/10/2023 8:41:14 PM  Radiology: No results found.   Procedures   Medications Ordered in the ED  sodium chloride  0.9 % bolus 1,000 mL (0 mLs Intravenous Stopped 11/10/23 2212)                                    Medical Decision Making Cardiac monitor interpretation: Sinus rhythm, no ectopy  Patient's lab workup reviewed by me and unremarkable and vitals are stable.  Was given some IV fluids.  She has no electrolyte abnormalities.  May have had a panic attack per her mother.  I will give her prescription for Zofran  to use as needed for nausea she is advised to follow-up with primary care to further discuss her medications.  Advised to return to the ER for any new or worsening symptoms.  She feels comfortable to plan to be discharged home.  Problems Addressed: Panic attack: acute illness or injury Paresthesias: acute illness or injury  Amount and/or Complexity of Data Reviewed External Data Reviewed: notes.    Details: Outpatient records reviewed and patient was recently seen in the office and started on new weight loss medicine Labs: ordered. Decision-making details documented in ED Course.    Details: Ordered and reviewed by me and unremarkable  Risk OTC drugs. Prescription drug management.     Final diagnoses:  Paresthesias  Panic attack    ED Discharge Orders          Ordered    ondansetron  (ZOFRAN ) 4 MG  tablet  Every 8 hours PRN        11/10/23 2218               Katelynd Blauvelt L, DO 11/10/23 2220

## 2023-11-10 NOTE — Discharge Instructions (Addendum)
 You can use Zofran  as needed for nausea and vomiting.  Follow-up with your primary care doctor to discuss your medicines.  Return to the ER for any new or worsening symptoms.

## 2023-11-10 NOTE — ED Triage Notes (Signed)
 Pt reports feeling like a heaviness on tongue and bilateral arm numbness. Pt started taking Weygovy yesterday and reports multiple bouts of vomiting today.

## 2024-04-30 ENCOUNTER — Ambulatory Visit (HOSPITAL_BASED_OUTPATIENT_CLINIC_OR_DEPARTMENT_OTHER): Admitting: Student
